# Patient Record
Sex: Female | Born: 1960 | Race: White | Hispanic: No | Marital: Married | Smoking: Never smoker
Health system: Southern US, Community
[De-identification: ages and names within clinical notes are randomized; demographics above are authoritative.]

## PROBLEM LIST (undated history)

## (undated) DIAGNOSIS — G43909 Migraine, unspecified, not intractable, without status migrainosus: Secondary | ICD-10-CM

## (undated) DIAGNOSIS — M25519 Pain in unspecified shoulder: Secondary | ICD-10-CM

## (undated) DIAGNOSIS — G44009 Cluster headache syndrome, unspecified, not intractable: Secondary | ICD-10-CM

## (undated) DIAGNOSIS — K219 Gastro-esophageal reflux disease without esophagitis: Secondary | ICD-10-CM

## (undated) DIAGNOSIS — I351 Nonrheumatic aortic (valve) insufficiency: Secondary | ICD-10-CM

## (undated) DIAGNOSIS — M8580 Other specified disorders of bone density and structure, unspecified site: Secondary | ICD-10-CM

## (undated) DIAGNOSIS — I4719 Other supraventricular tachycardia: Secondary | ICD-10-CM

## (undated) DIAGNOSIS — R55 Syncope and collapse: Secondary | ICD-10-CM

## (undated) DIAGNOSIS — I340 Nonrheumatic mitral (valve) insufficiency: Secondary | ICD-10-CM

## (undated) DIAGNOSIS — R42 Dizziness and giddiness: Secondary | ICD-10-CM

## (undated) DIAGNOSIS — M797 Fibromyalgia: Secondary | ICD-10-CM

## (undated) DIAGNOSIS — I10 Essential (primary) hypertension: Secondary | ICD-10-CM

## (undated) DIAGNOSIS — K8020 Calculus of gallbladder without cholecystitis without obstruction: Secondary | ICD-10-CM

## (undated) DIAGNOSIS — E042 Nontoxic multinodular goiter: Secondary | ICD-10-CM

## (undated) DIAGNOSIS — E663 Overweight: Secondary | ICD-10-CM

## (undated) HISTORY — DX: Pain in unspecified shoulder: M25.519

## (undated) HISTORY — DX: Other specified disorders of bone density and structure, unspecified site: M85.80

## (undated) HISTORY — DX: Migraine, unspecified, not intractable, without status migrainosus: G43.909

## (undated) HISTORY — DX: Dizziness and giddiness: R42

## (undated) HISTORY — DX: Syncope and collapse: R55

## (undated) HISTORY — DX: Calculus of gallbladder without cholecystitis without obstruction: K80.20

## (undated) HISTORY — DX: Nonrheumatic aortic (valve) insufficiency: I35.1

## (undated) HISTORY — DX: Nontoxic multinodular goiter: E04.2

## (undated) HISTORY — DX: Gastro-esophageal reflux disease without esophagitis: K21.9

## (undated) HISTORY — DX: Other supraventricular tachycardia: I47.19

## (undated) HISTORY — DX: Overweight: E66.3

## (undated) HISTORY — DX: Nonrheumatic mitral (valve) insufficiency: I34.0

## (undated) HISTORY — DX: Essential (primary) hypertension: I10

## (undated) HISTORY — DX: Fibromyalgia: M79.7

## (undated) HISTORY — DX: Cluster headache syndrome, unspecified, not intractable: G44.009

---

## 1998-11-09 ENCOUNTER — Other Ambulatory Visit: Admission: RE | Admit: 1998-11-09 | Discharge: 1998-11-09 | Payer: Self-pay | Admitting: Obstetrics and Gynecology

## 1999-11-09 ENCOUNTER — Other Ambulatory Visit: Admission: RE | Admit: 1999-11-09 | Discharge: 1999-11-09 | Payer: Self-pay | Admitting: Obstetrics and Gynecology

## 2000-11-27 ENCOUNTER — Other Ambulatory Visit: Admission: RE | Admit: 2000-11-27 | Discharge: 2000-11-27 | Payer: Self-pay | Admitting: Obstetrics and Gynecology

## 2001-11-28 ENCOUNTER — Other Ambulatory Visit: Admission: RE | Admit: 2001-11-28 | Discharge: 2001-11-28 | Payer: Self-pay | Admitting: Obstetrics and Gynecology

## 2002-12-04 ENCOUNTER — Other Ambulatory Visit: Admission: RE | Admit: 2002-12-04 | Discharge: 2002-12-04 | Payer: Self-pay | Admitting: Obstetrics and Gynecology

## 2003-11-01 HISTORY — PX: MYOMECTOMY: SHX85

## 2004-01-01 ENCOUNTER — Other Ambulatory Visit: Admission: RE | Admit: 2004-01-01 | Discharge: 2004-01-01 | Payer: Self-pay | Admitting: Obstetrics and Gynecology

## 2004-02-10 ENCOUNTER — Encounter (INDEPENDENT_AMBULATORY_CARE_PROVIDER_SITE_OTHER): Payer: Self-pay | Admitting: *Deleted

## 2004-02-10 ENCOUNTER — Inpatient Hospital Stay (HOSPITAL_COMMUNITY): Admission: RE | Admit: 2004-02-10 | Discharge: 2004-02-12 | Payer: Self-pay | Admitting: Obstetrics and Gynecology

## 2007-09-11 ENCOUNTER — Emergency Department (HOSPITAL_COMMUNITY): Admission: EM | Admit: 2007-09-11 | Discharge: 2007-09-12 | Payer: Self-pay | Admitting: Emergency Medicine

## 2007-09-12 IMAGING — CR DG CHEST 2V
2 series · 2 of 2 positions shown · non-contrast
Comparison: None.

CLINICAL DATA: Syncope..
 CHEST - 2 VIEW:

[w chest pa]
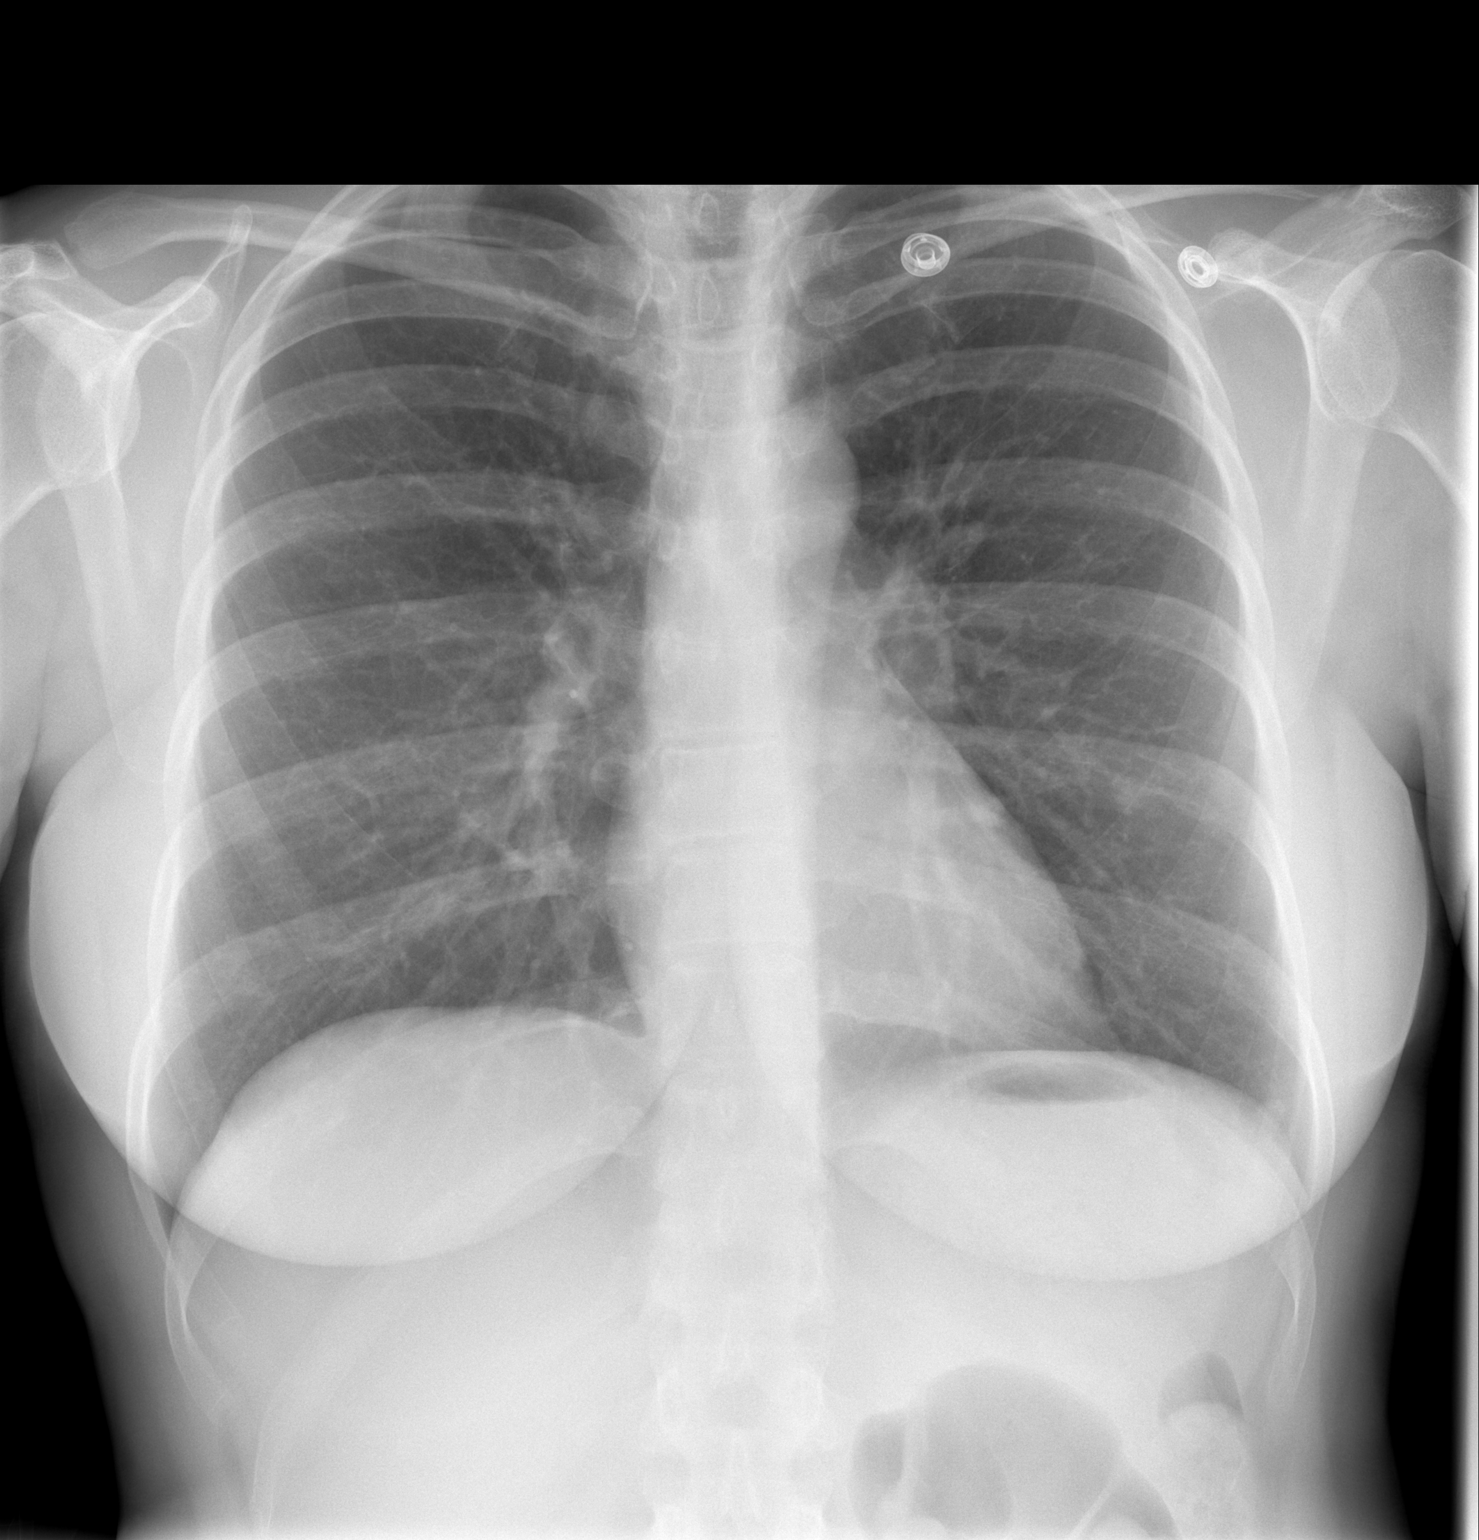

[w chest lat]
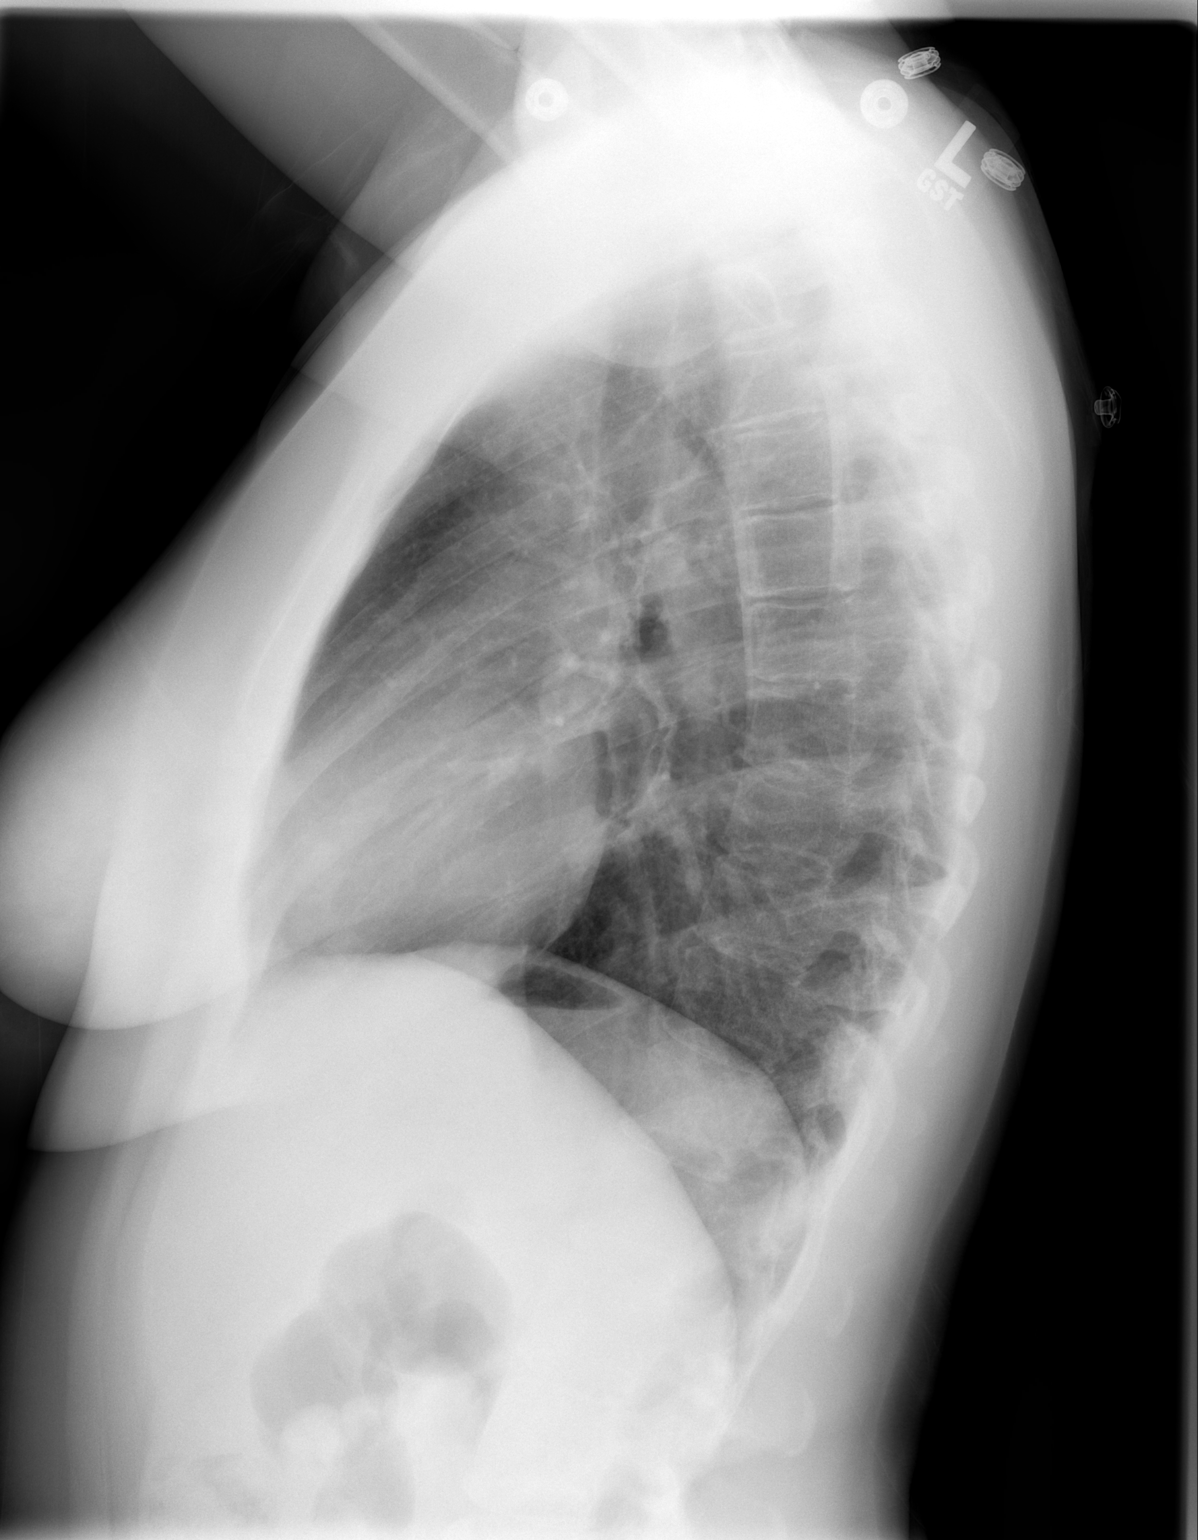

[2 of 2 positions shown; findings below may reference images not displayed]

FINDINGS: The lungs are clear. The costophrenic angles are sharp. Heart size normal. No bony abnormality.
IMPRESSION: Negative chest.

## 2010-10-31 HISTORY — PX: THYROID SURGERY: SHX805

## 2011-02-18 ENCOUNTER — Other Ambulatory Visit: Payer: Self-pay | Admitting: Family Medicine

## 2011-02-18 DIAGNOSIS — R221 Localized swelling, mass and lump, neck: Secondary | ICD-10-CM

## 2011-02-23 ENCOUNTER — Ambulatory Visit
Admission: RE | Admit: 2011-02-23 | Discharge: 2011-02-23 | Disposition: A | Discharge status: Home / Self Care | Payer: BC Managed Care – PPO | Source: Ambulatory Visit | Attending: Family Medicine | Admitting: Family Medicine

## 2011-02-23 DIAGNOSIS — R221 Localized swelling, mass and lump, neck: Secondary | ICD-10-CM

## 2011-02-23 IMAGING — US US SOFT TISSUE HEAD/NECK
1 series · 14 of 25 positions shown · non-contrast
Comparison: None.

CLINICAL DATA: Thyroid nodule on physical exam

THYROID ULTRASOUND
TECHNIQUE: Ultrasound examination of the thyroid gland and adjacent
soft tissues was performed.

[Series 1: us soft tissue head/neck · 0.10mm/px · 14 of 73 slices shown]
[im 1/73]
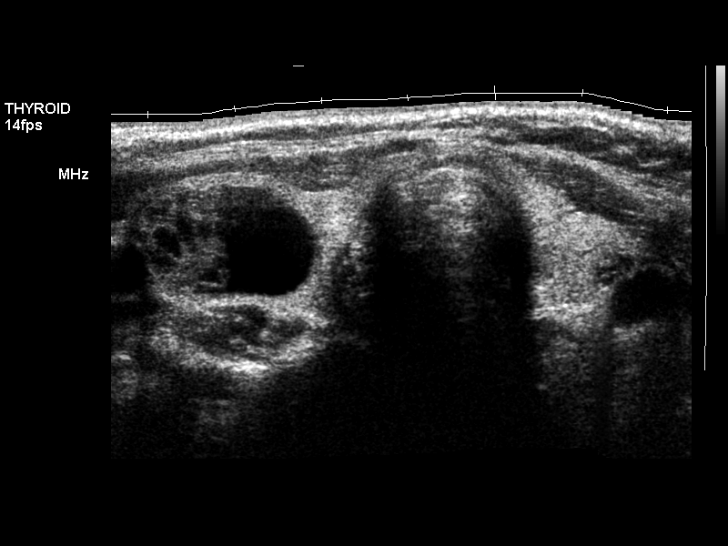
[im 7/73]
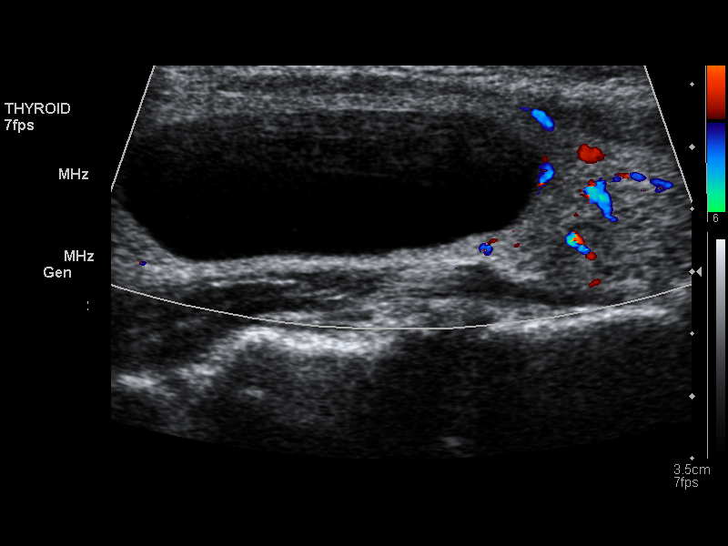
[im 13/73]
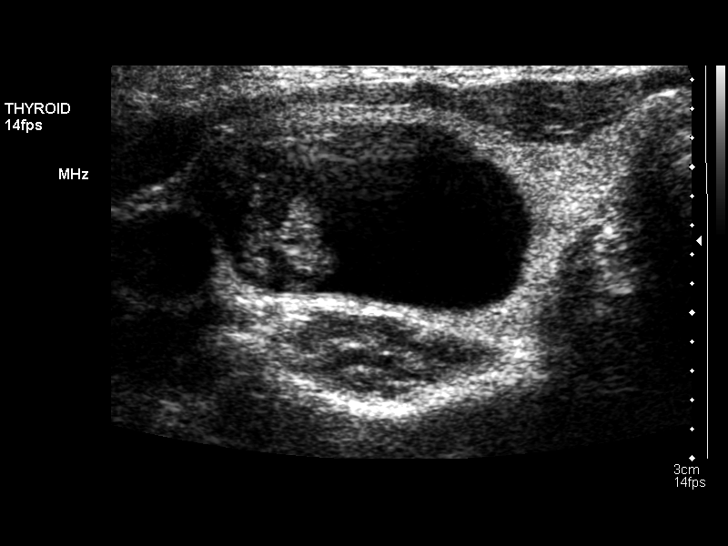
[im 19/73]
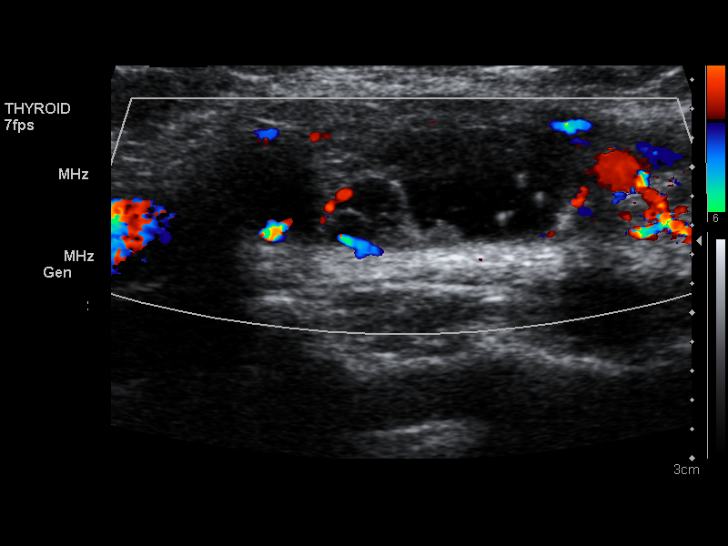
[im 25/73]
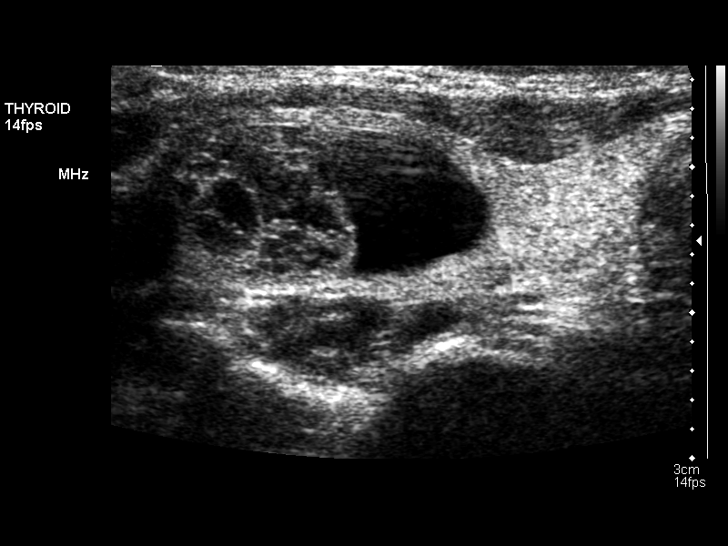
[im 28/73]
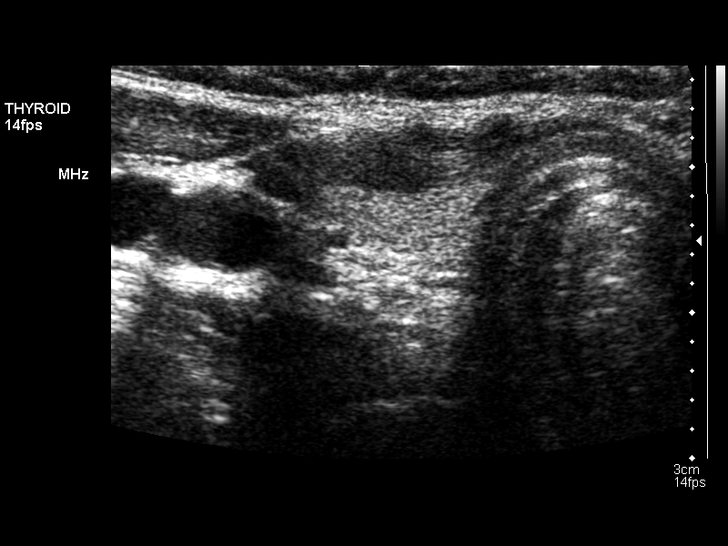
[im 34/73]
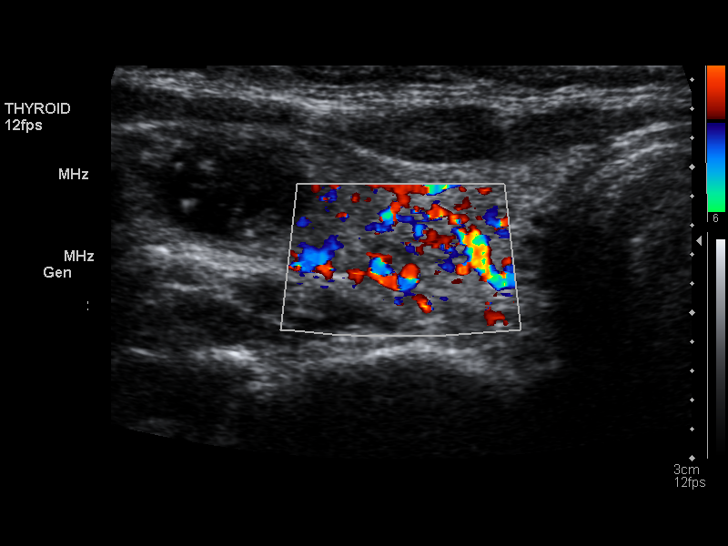
[im 40/73]
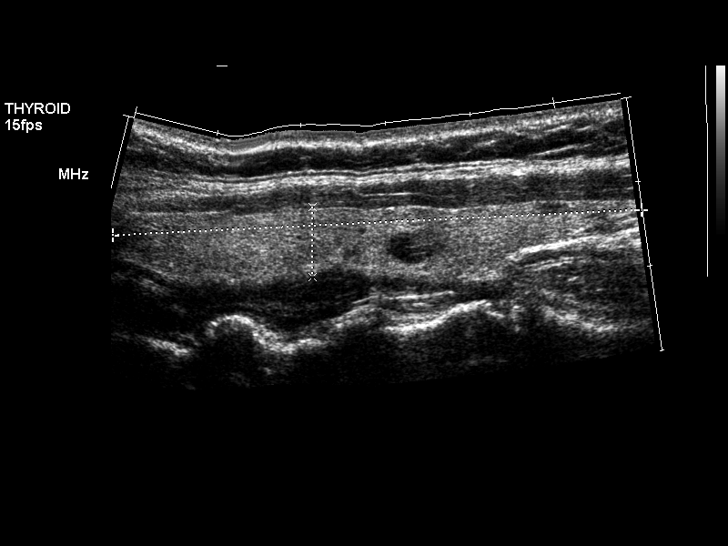
[im 46/73]
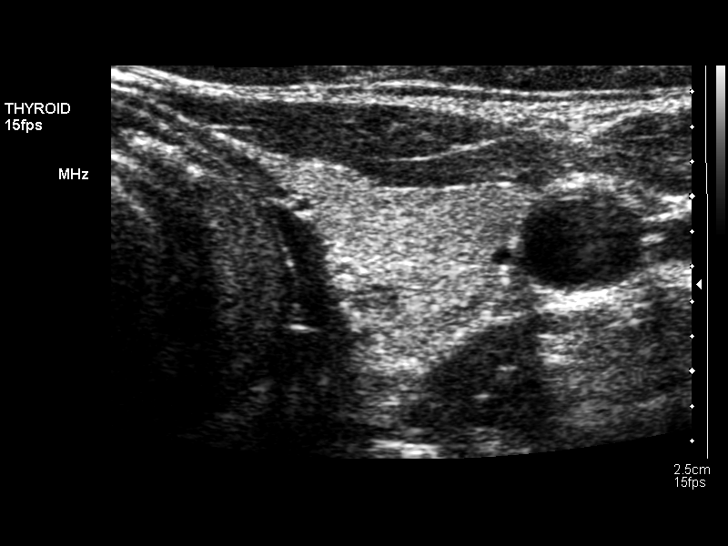
[im 49/73]
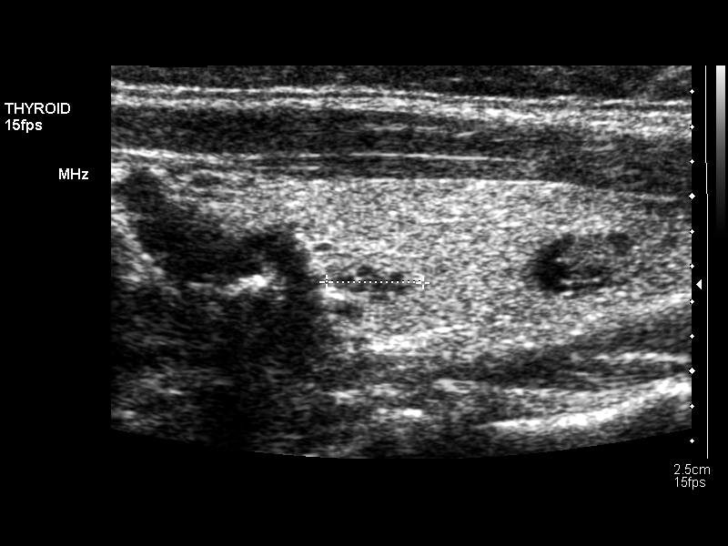
[im 55/73]
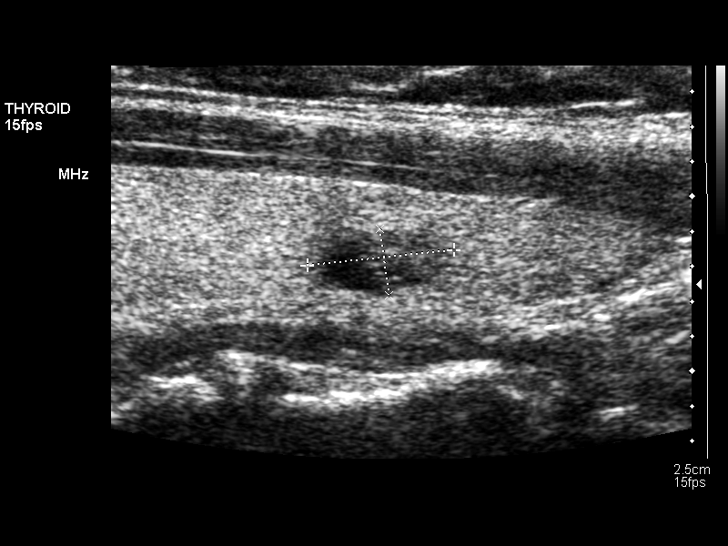
[im 61/73]
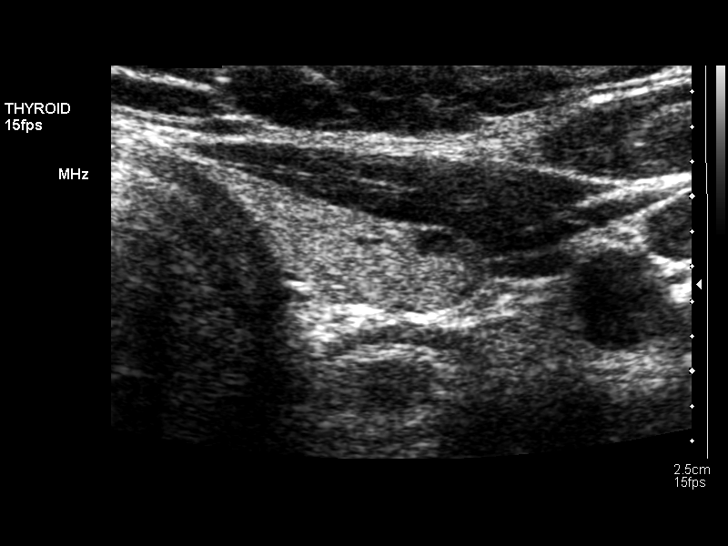
[im 67/73]
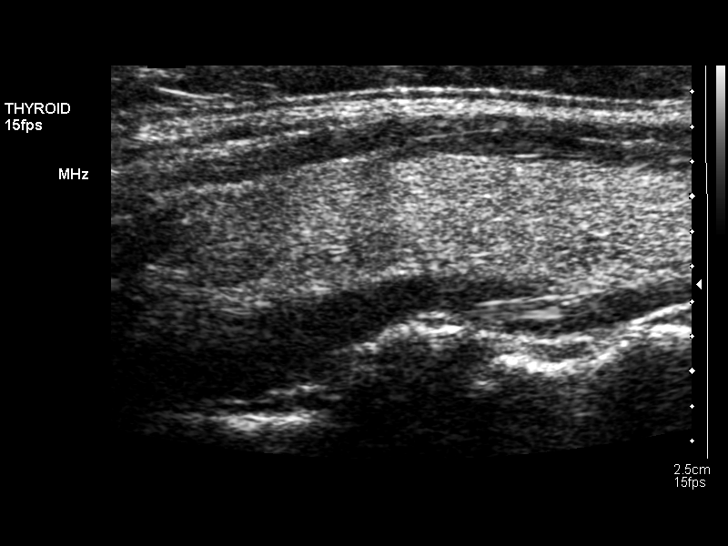
[im 73/73]
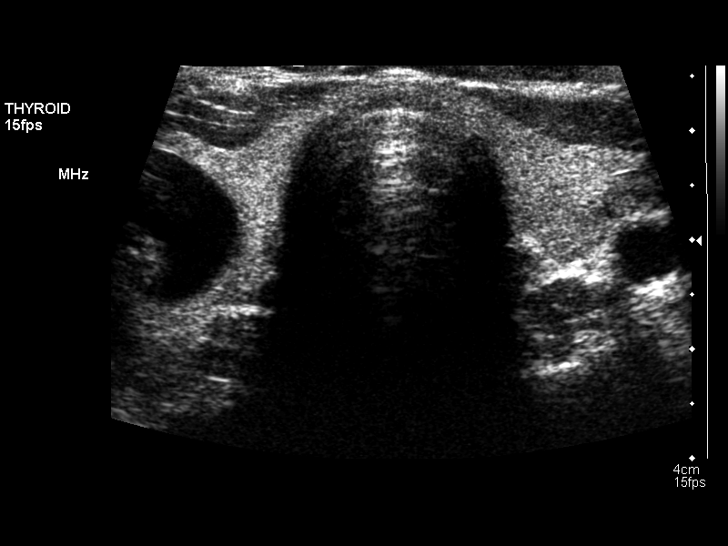

[14 of 25 positions shown; findings below may reference images not displayed]

FINDINGS: Right thyroid lobe:  6.2 x 1.6 x 2.7 cm.
Left thyroid lobe:  6.2 x 0.8 x 1.7 cm.
Isthmus:  3 mm.

Focal nodules:  A complex nodule is noted in the right lobe which
is primarily cystic with some internal soft tissue components
measuring 2.3 x 1.2 x 3.4 cm. Small nodules are noted bilaterally
of no more than 8 mm in diameter.

Lymphadenopathy:  None visualized.
IMPRESSION: The dominant nodule on the right is complex but largely cystic
measuring 2.3 x 1.2 x 3.4 cm.

## 2011-02-24 ENCOUNTER — Other Ambulatory Visit: Payer: Self-pay | Admitting: Family Medicine

## 2011-02-24 DIAGNOSIS — E041 Nontoxic single thyroid nodule: Secondary | ICD-10-CM

## 2011-03-01 ENCOUNTER — Other Ambulatory Visit (HOSPITAL_COMMUNITY)
Admission: RE | Admit: 2011-03-01 | Discharge: 2011-03-01 | Disposition: A | Discharge status: Home / Self Care | Payer: BC Managed Care – PPO | Source: Ambulatory Visit | Attending: Interventional Radiology | Admitting: Interventional Radiology

## 2011-03-01 ENCOUNTER — Ambulatory Visit
Admission: RE | Admit: 2011-03-01 | Discharge: 2011-03-01 | Disposition: A | Discharge status: Home / Self Care | Payer: BC Managed Care – PPO | Source: Ambulatory Visit | Attending: Family Medicine | Admitting: Family Medicine

## 2011-03-01 ENCOUNTER — Other Ambulatory Visit: Payer: Self-pay | Admitting: Interventional Radiology

## 2011-03-01 DIAGNOSIS — E049 Nontoxic goiter, unspecified: Secondary | ICD-10-CM | POA: Insufficient documentation

## 2011-03-01 DIAGNOSIS — E041 Nontoxic single thyroid nodule: Secondary | ICD-10-CM

## 2011-03-01 IMAGING — US US THYROID BIOPSY
1 series · 14 of 15 positions shown · non-contrast
Comparison: [DATE]

CLINICAL DATA: Dominant complex cystic right thyroid nodule

ULTRASOUND GUIDED NEEDLE ASPIRATE BIOPSY OF THE THYROID GLAND

[Series 1: us thyroid biopsy · 0.07mm/px · 15 acquisitions, 14 frames shown]
[im 1/15]
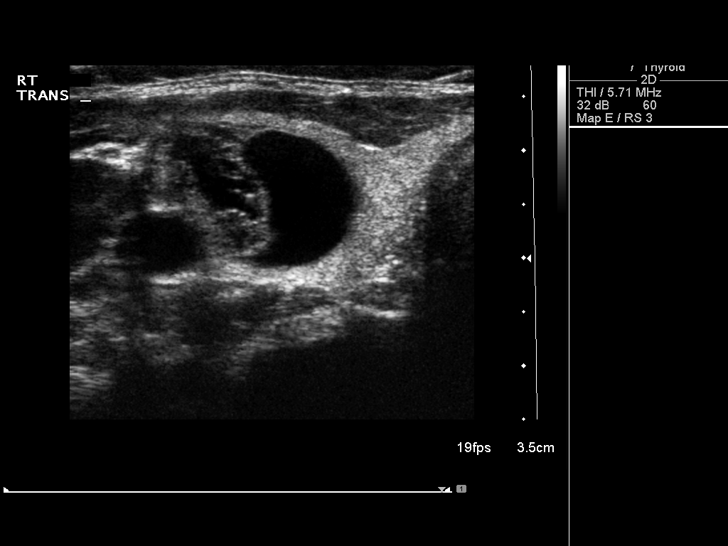
[im 2/15]
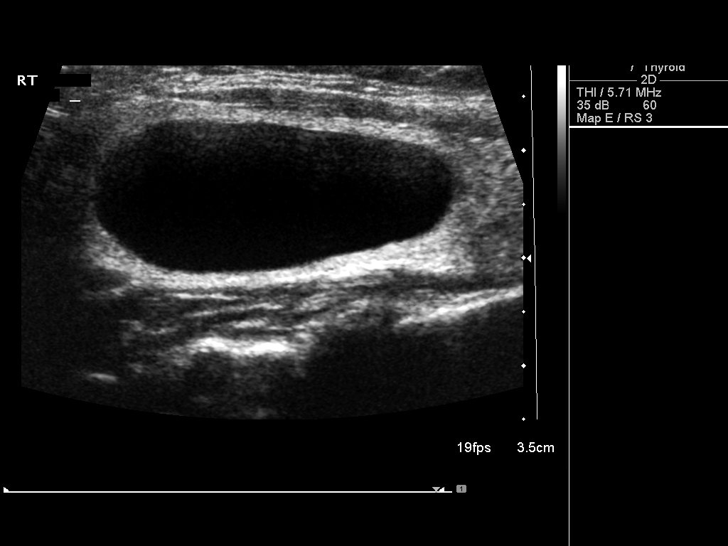
[im 3/15]
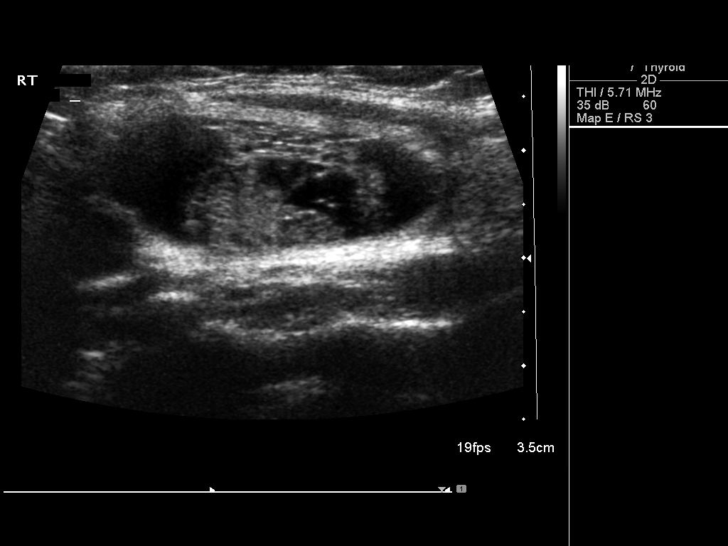
[im 4/15]
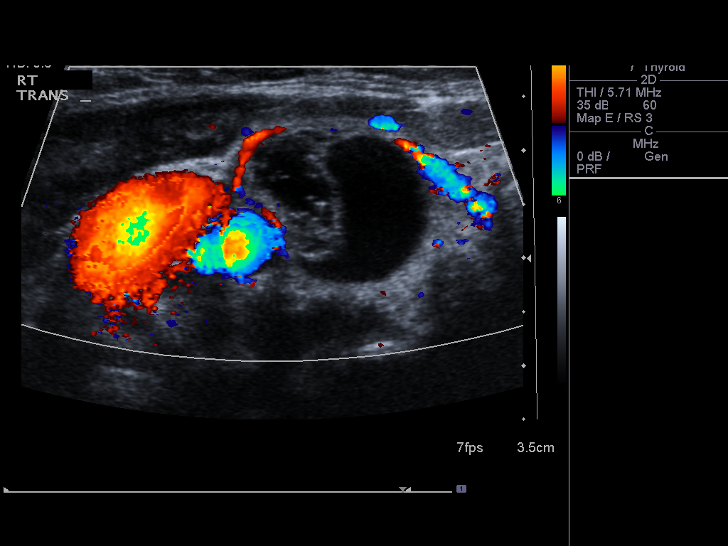
[im 5/15]
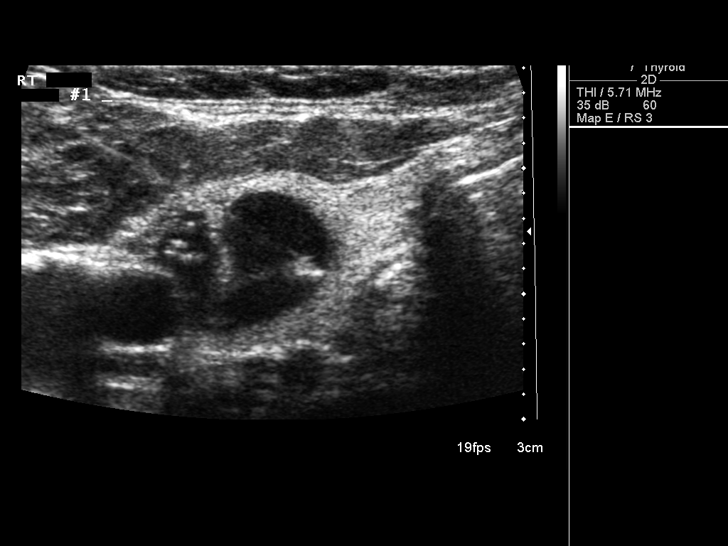
[im 6/15]
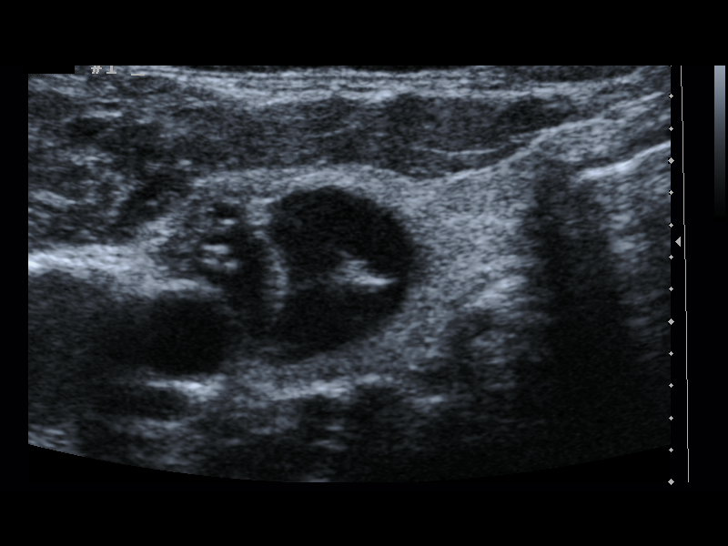
[im 7/15]
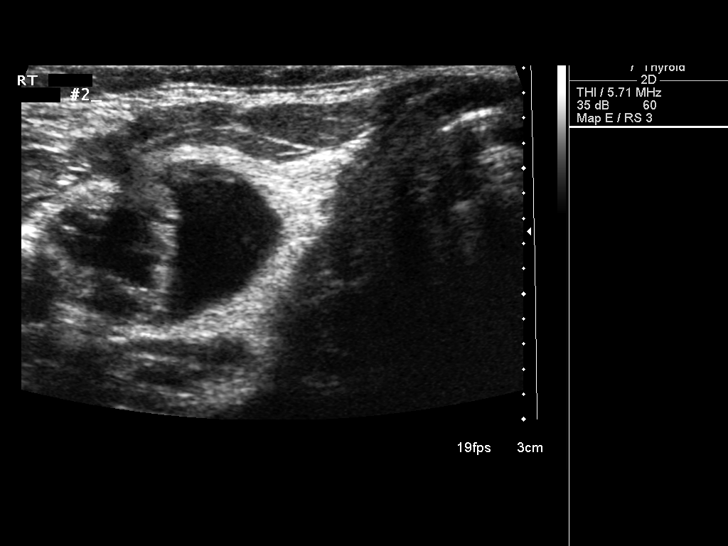
[im 9/15]
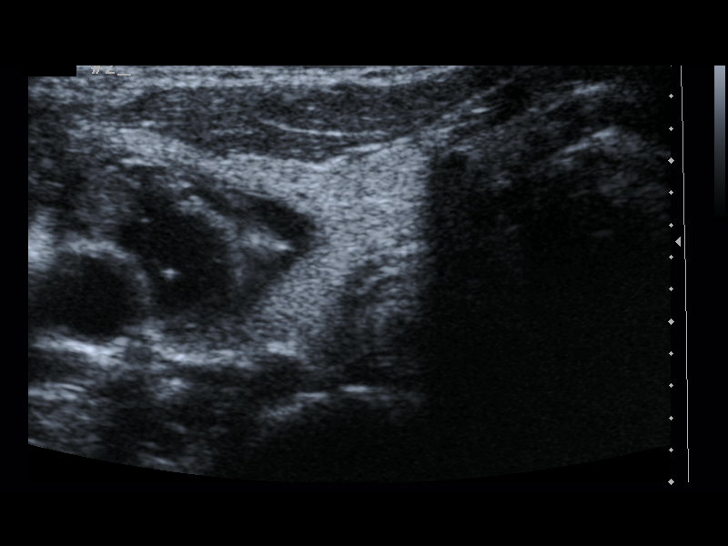
[im 10/15]
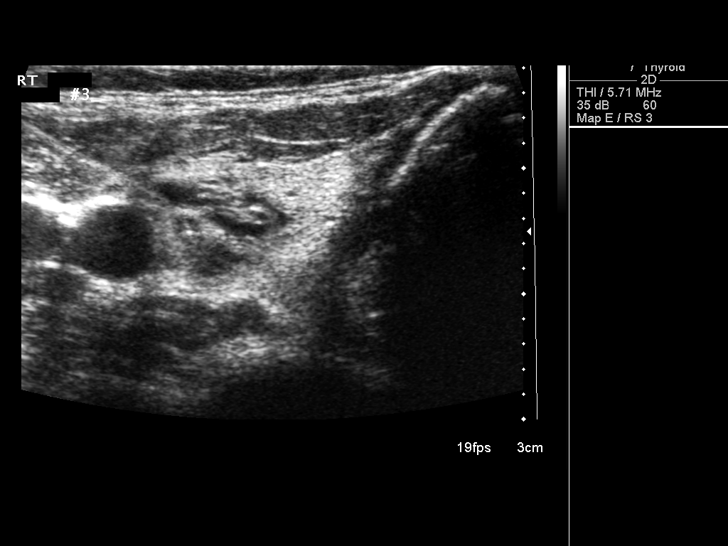
[im 11/15]
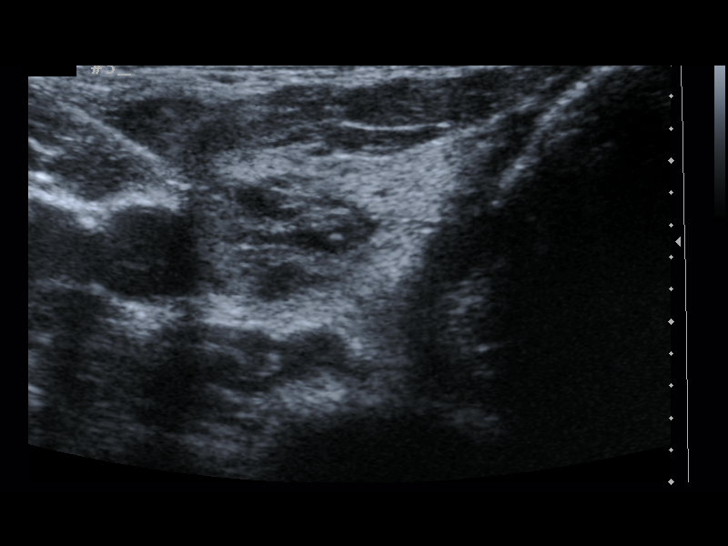
[im 12/15]
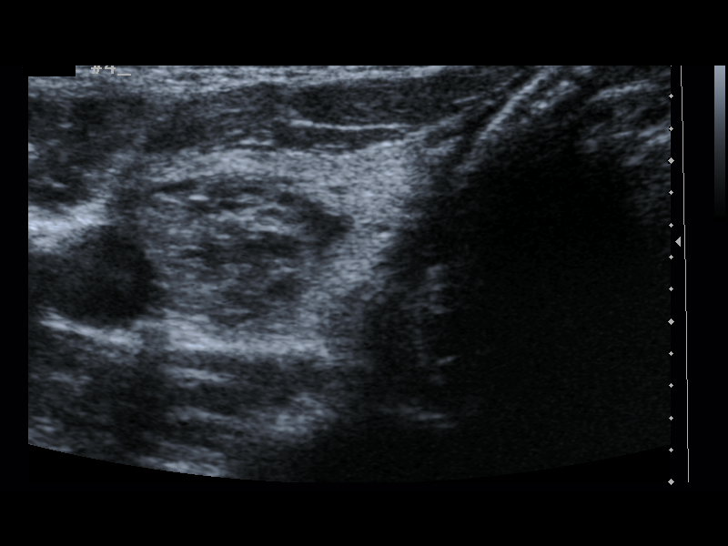
[im 13/15]
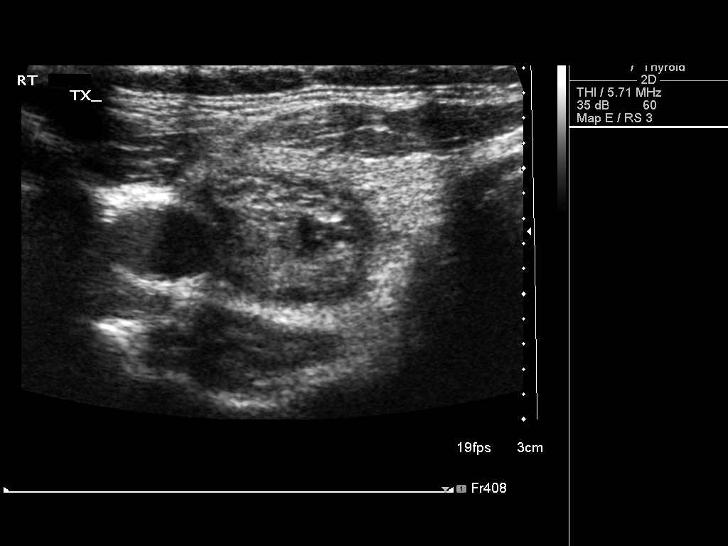
[im 14/15]
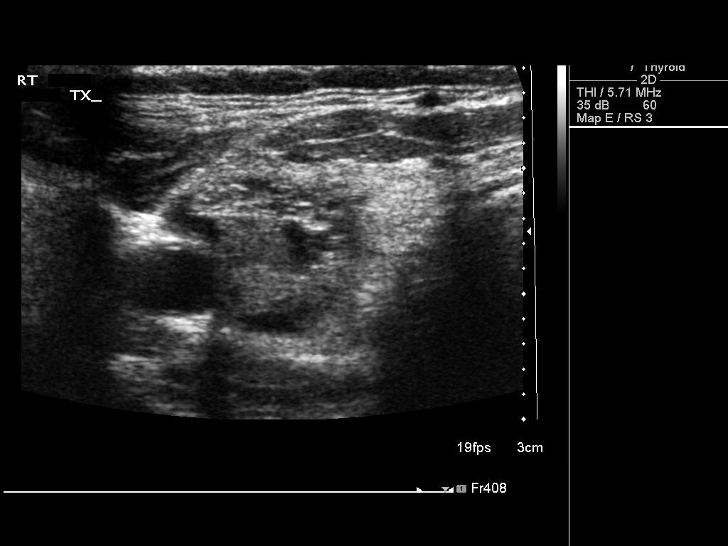
[im 15/15]
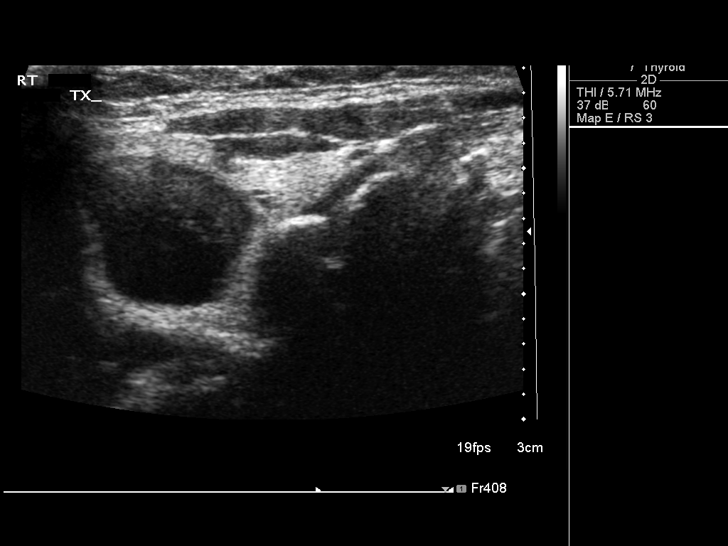

[14 of 15 positions shown; findings below may reference images not displayed]

Thyroid biopsy was thoroughly discussed with the patient and
questions were answered.  The benefits, risks, alternatives, and
complications were also discussed.  The patient understands and
wishes to proceed with the procedure.  Written consent was
obtained.

Ultrasound was performed to localize and mark an adequate site for
the biopsy.  The patient was then prepped and draped in a normal
sterile fashion.  Local anesthesia was provided with 1% lidocaine.
Using direct ultrasound guidance, 4 passes were made using 25 gauge
needles into the nodule within the right lobe of the thyroid.
Ultrasound was used to confirm needle placements on all occasions.
Specimens were sent to Pathology for analysis.

Complications:  No immediate
FINDINGS: Imaging confirms needle placement in the dominant right
complex cystic thyroid nodule
IMPRESSION: Ultrasound guided needle aspirate biopsy performed of the dominant
right thyroid nodule.

## 2011-03-18 NOTE — Op Note (Signed)
[name], [name]                            ACCOUNT NO.:  [id number]   MEDICAL RECORD NO.:  [id number]                   PATIENT TYPE:  INP   LOCATION:  9399                                 FACILITY:  [facility]   PHYSICIAN:  Maxie Better, M.D.            DATE OF BIRTH:  12-21-1960   DATE OF PROCEDURE:  02/10/2004  DATE OF DISCHARGE:                                 OPERATIVE REPORT   PREOPERATIVE DIAGNOSES:  Menorrhagia, uterine fibroids.   POSTOPERATIVE DIAGNOSES:  Submucosal fibroids, subserosal intramural and  pedunculated fibroids, menorrhagia.   PROCEDURE:  Exploratory laparotomy, multiple myomectomy.   ANESTHESIA:  General.   SURGEON:  Maxie Better, M.D.   ASSISTANT:  Cordelia Pen A. Rosalio Macadamia, M.D.   INDICATIONS FOR PROCEDURE:  This is a 50 year old, gravida 0, female on  birth control pills with symptomatic uterine fibroids who now presents for  surgical management.  Please see the dictated H&P for the specific details.  The risks and benefits of the procedure have been explained to the patient,  consent was signed, the patient was transferred to the operating room.   DESCRIPTION OF PROCEDURE:  Under adequate general anesthesia, the patient  was placed in the supine position.  She was sterilely prepped and draped in  the usual fashion, indwelling Foley catheter was sterilely placed.  A 0.25%  Marcaine was injected along the planned Pfannenstiel skin incision. A small  Pfannenstiel skin incision was then made, carried down to the rectus fascia,  the rectus fascia was incised in the midline, extended bilaterally. The  rectus fascia was bluntly and sharply dissected off the rectus muscle and  superior and inferior fascia in the rectus muscle was split in the midline.  The paritoperitoneum was entered bluntly. The uterus was then exteriorized  and was noted to have multiple fibroids. Both tubes and ovaries were normal.  The fibroids were located right posterior cornual  area about 2 1/2 cm,  several other smaller 1 cm less than a pea size and a large posterior  fibroid that was felt to be practically intracavitary.  A dilute solution of  Pitressin was injected along the planned posterior incision. A posterior  incision was then made vertically and incorporated the right posterior  fundal/cornual fibroids. The intracavitary fibroid was then removed at which  time the cavity had been clearly entered, palpation of the cavity was  notable for multiple smaller submucosal fibroids on the anterior aspect of  the uterus. The additional smaller fibroids were then removed in a similar  fashion including the submucosal fibroids. There was no endometrial polyp  noted. After all fibroids had been removed and the uterus palpated and no  other palpable fibroids noted, the incisions were then closed taking care  not to incorporate the endometrial stripe. The defect in the uterus was  closed with #0 Vicryl sutures to the subserosal surface at which time a 3-0  Vicryl baseball stitch was  then utilized to close the large posterior  incision and the smaller incisions on the uterus for the subserosal  fibroids.  Additional suturing was then performed to accomplish hemostasis.  Good hemostasis was then noted, the abdomen was then irrigated, suctioned of  debris and Intercede was placed over the overlying incision. The uterus was  then placed back in the abdomen, the upper abdomen and the appendix were not  explored.  The parietoperitoneum was now closed. The under surface of the  rectus fascia was inspected, small bleeders cauterized. The fascia was  closed with #0 Vicryl x2, the subcutaneous area was irrigated, small  bleeders cauterized and a subcuticular stitch was then performed using 4-0  Vicryl suture.  The specimen was myoma x19, estimated blood loss was 50 mL,  intraoperative fluid was 2 liters, urine output was 425 mL clear yellow  urine. Sponge and instrument counts  x2 was correct. Complications none.  The  patient tolerated the procedure well and was transferred to recovery in  stable condition.                                               Maxie Better, M.D.    [place]/MEDQ  D:  02/10/2004  T:  02/10/2004  Job:  [id number]

## 2011-03-18 NOTE — Discharge Summary (Signed)
[name], [name]                            ACCOUNT NO.:  [id number]   MEDICAL RECORD NO.:  [id number]                   PATIENT TYPE:  INP   LOCATION:  9311                                 FACILITY:  [facility]   PHYSICIAN:  Maxie Better, M.D.            DATE OF BIRTH:  [birth year]   DATE OF ADMISSION:  02/10/2004  DATE OF DISCHARGE:                                 DISCHARGE SUMMARY   ADMISSION DIAGNOSIS:  Menorrhagia, uterine fibroid.   DISCHARGE DIAGNOSES:  1. Submucosal fibroid.  2. Subserosal, intramural, and pedunculated fibroids.  3. Menorrhagia.  4. Postoperative anemia.   PROCEDURE:  Exploratory laparotomy with multiple myomectomy.   HISTORY OF PRESENT ILLNESS:  A 50 year old gravida 0 female on birth control  pills with symptomatic uterine fibroids admitted for surgical management.   HOSPITAL COURSE:  The patient was admitted to [facility].  She was  taken to the operating room where she underwent exploratory laparotomy and  multiple myomectomy.  Please see the dictated operative note for the  specific details.  The patient's postoperative course was complicated by  temperature maximum of 100.3 which subsequently spontaneously defervesced.  The patient was passing flatus by postoperative day #1, tolerating a regular  diet, and by postoperative day #2 was desirous of going home.  Her CBC on  postoperative day #1 showed a hemoglobin of 10.7, hematocrit 32.4, white  count of 7.3  Her incision showed no erythema, induration, or exudate.  Her  pathology was consistent with myomas.   DISPOSITION:  Home.   CONDITION:  Stable.   DISCHARGE MEDICATIONS:  1. Tylox #20 one to two tablets q.3-4h. p.r.n. pain.  2. Motrin 800 mg one p.o. q.6-8h. p.r.n. pain.   DISCHARGE INSTRUCTIONS:  As per the GYN sheet.   FOLLOW-UP APPOINTMENT:  In 4-6 weeks.                                               Maxie Better, M.D.    [place]/MEDQ  D:  02/12/2004  T:  02/12/2004  Job:   [id number]

## 2011-03-18 NOTE — H&P (Signed)
[name], [name]                            ACCOUNT NO.:  [id number]   MEDICAL RECORD NO.:  [id number]                   PATIENT TYPE:  INP   LOCATION:  NA                                   FACILITY[facility]  [facility]   PHYSICIAN:  Maxie Better, M.D.            DATE OF BIRTH:  1961-07-05   DATE OF ADMISSION:  02/10/2004  DATE OF DISCHARGE:                                HISTORY & PHYSICAL   CHIEF COMPLAINT:  Uterine fibroids, menorrhagia.   HISTORY OF PRESENT ILLNESS:  A 50 year old G0, single white female with  symptomatic uterine fibroids, now being admitted for a myomectomy.  Ultrasound on November 17, 2003, revealed a 3.5 x 3.2 x 4 cm anterior fibroid  pushing into the endometrial cavity.  The patient had a sonohysterogram on  December 15, 2003, that confirmed submucosal fibroid component.  Endometrial  biopsy on November 03, 2003, showed presence of a benign endometrial polyp,  proliferative endometrium.  The patient has had heavy bleeding despite the  use of oral contraceptive therapy.  The patient also describes very painful  cycles despite pills.  The patient does not desire any childbearing, however  wants conservative management at this time.   PAST MEDICAL HISTORY:  No known drug allergies.   Medicines are Maxalt p.r.n., Alesse.   Medical history is migraines.   Surgical history is negative.   FAMILY HISTORY:  Positive for hypertension in mother and father.  No  ovarian, colon, or breast cancer.   SOCIAL HISTORY:  Same partner for 20 years.  Works at [employer], [employer].   REVIEW OF SYSTEMS:  As per HPI with respect to GU system.  The patient also  complains of decreased libido.  She has had blood in the stool in the past,  for which she underwent a GI evaluation in 2001.   PHYSICAL EXAMINATION:  GENERAL:  A well-developed, well-nourished white  female in no acute distress.  VITAL SIGNS:  Blood pressure 122/80, temperature of 98.4, weight is 164  pounds.  SKIN:  No  lesions.  HEENT:  Anicteric sclerae, pink conjunctivae, oropharynx negative.  CARDIAC:  Regular rate and rhythm without murmur.  CHEST:  Lungs are clear to auscultation.  BREASTS:  Soft, nontender, no palpable mass.  NODES:  No palpable supraclavicular, axillary, or inguinal nodes.  NECK:  Supple, thyroid not palpable.  BACK:  No CVA tenderness.  ABDOMEN:  Soft, nontender.  PELVIC:  Vulva shows no lesions.  Vagina had no discharge.  Cervix was round  os.  Uterus was retroverted, bulky.  Adnexa:  No palpable mass.  RECTAL:  Deferred.   IMPRESSION:  Symptomatic uterine fibroids.   PLAN:  Admission, myomectomy.  Routine admission labs.  Risk of procedure  was reviewed, including but not limited to infection, bleeding, possible  need for blood transfusion, up to 30% chance of needing myomectomy or  hysterectomy in the future, injury to surrounding organ  structure, internal  scar tissue.  Discussed with the patient the possibility of adenomyoma  mimicking fibroids, about less than 2% chance of need for hysterectomy for  life-threatening bleeding.  Postop care and criteria for discharge were  reviewed.  All questions answered.                                               Maxie Better, M.D.    [place]/MEDQ  D:  02/09/2004  T:  02/09/2004  Job:  [id number]

## 2011-03-21 ENCOUNTER — Other Ambulatory Visit: Payer: Self-pay | Admitting: Obstetrics and Gynecology

## 2011-08-09 LAB — BASIC METABOLIC PANEL
BUN: 16
Calcium: 9.5
GFR calc non Af Amer: >60
Glucose, Bld: 109 — ABNORMAL HIGH
Potassium: 4.3

## 2011-08-09 LAB — CBC
HCT: 42
Hemoglobin: 14.1
MCHC: 33.5
MCV: 93
Platelets: 305
RBC: 4.52
RDW: 14
WBC: 7.4

## 2011-08-09 LAB — DIFFERENTIAL
Basophils Absolute: 0
Basophils Relative: 1
Eosinophils Absolute: 0.1 — ABNORMAL LOW
Eosinophils Relative: 1
Lymphocytes Relative: 22
Lymphs Abs: 1.6
Monocytes Absolute: 0.5
Monocytes Relative: 6
Neutro Abs: 5.2
Neutrophils Relative %: 71

## 2011-08-09 LAB — BASIC METABOLIC PANEL WITH GFR
CO2: 32
Chloride: 98
Creatinine, Ser: 0.97
GFR calc Af Amer: >60
Sodium: 139

## 2011-08-09 LAB — D-DIMER, QUANTITATIVE: D-Dimer, Quant: <0.22

## 2011-08-09 LAB — POCT PREGNANCY, URINE
Operator id: 272551
Preg Test, Ur: NEGATIVE

## 2011-11-01 HISTORY — PX: OTHER SURGICAL HISTORY: SHX169

## 2012-01-16 ENCOUNTER — Encounter (INDEPENDENT_AMBULATORY_CARE_PROVIDER_SITE_OTHER): Payer: BC Managed Care – PPO | Admitting: Ophthalmology

## 2012-01-16 DIAGNOSIS — H3530 Unspecified macular degeneration: Secondary | ICD-10-CM

## 2012-01-16 DIAGNOSIS — H35379 Puckering of macula, unspecified eye: Secondary | ICD-10-CM

## 2012-01-16 DIAGNOSIS — H43819 Vitreous degeneration, unspecified eye: Secondary | ICD-10-CM

## 2012-01-16 DIAGNOSIS — H2510 Age-related nuclear cataract, unspecified eye: Secondary | ICD-10-CM

## 2012-02-23 ENCOUNTER — Other Ambulatory Visit: Payer: Self-pay | Admitting: Family Medicine

## 2012-02-23 DIAGNOSIS — E049 Nontoxic goiter, unspecified: Secondary | ICD-10-CM

## 2012-04-11 ENCOUNTER — Ambulatory Visit
Admission: RE | Admit: 2012-04-11 | Discharge: 2012-04-11 | Disposition: A | Discharge status: Home / Self Care | Payer: BC Managed Care – PPO | Source: Ambulatory Visit | Attending: Family Medicine | Admitting: Family Medicine

## 2012-04-11 DIAGNOSIS — E049 Nontoxic goiter, unspecified: Secondary | ICD-10-CM

## 2012-04-11 IMAGING — US US SOFT TISSUE HEAD/NECK
1 series · 14 of 25 positions shown · non-contrast
Comparison: Ultrasound of the thyroid of [DATE]

CLINICAL DATA: History of thyroid goiter, follow-up

THYROID ULTRASOUND
TECHNIQUE: Ultrasound examination of the thyroid gland and adjacent
soft tissues was performed.

[Series 1: us soft tissue head/neck · 0.05mm/px · 14 of 50 slices shown]
[im 1/50]
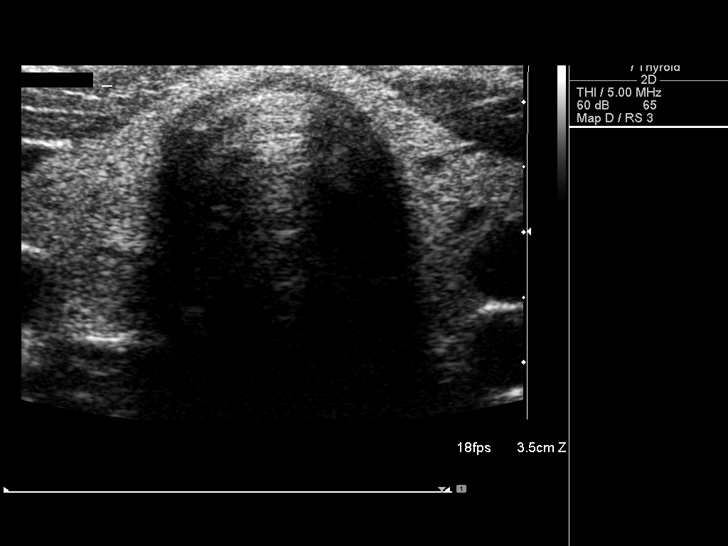
[im 5/50]
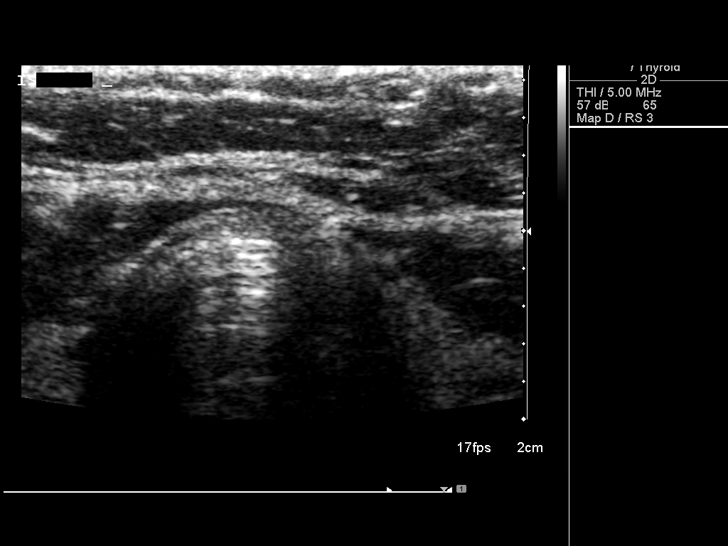
[im 9/50]
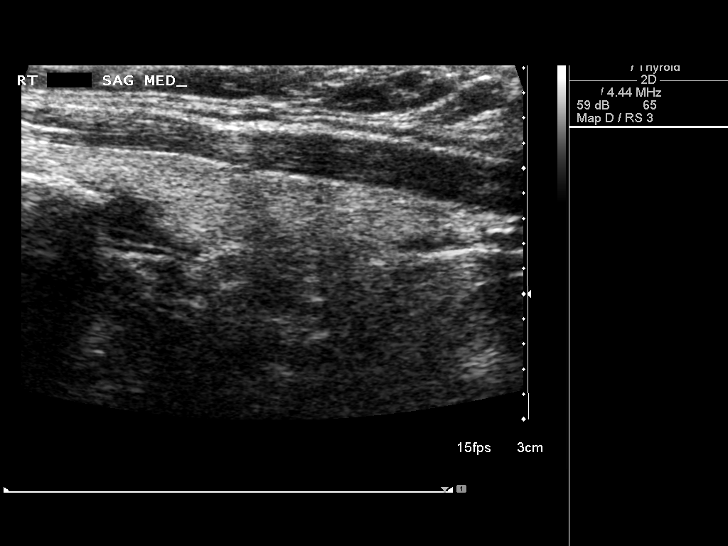
[im 13/50]
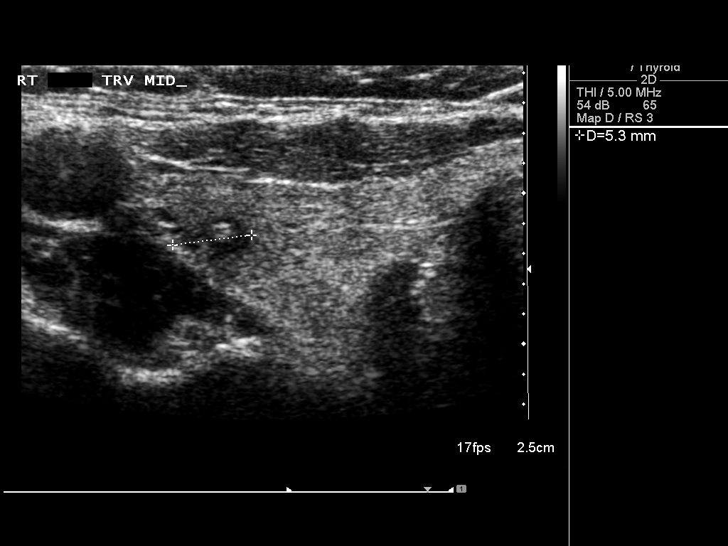
[im 17/50]
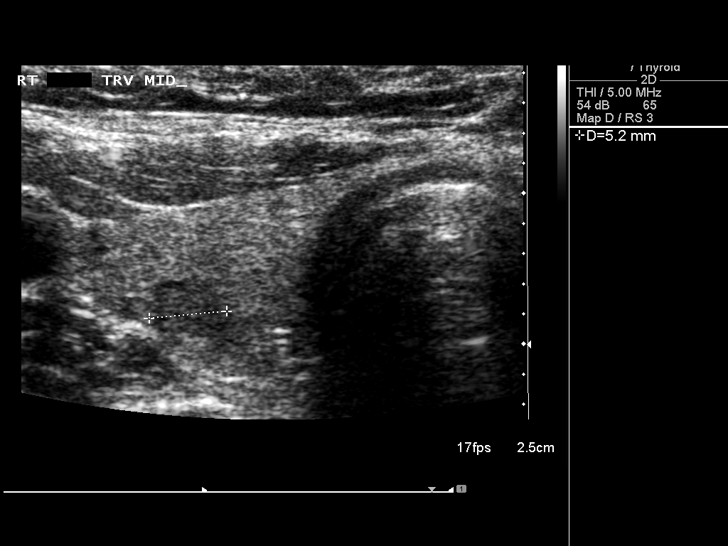
[im 19/50]
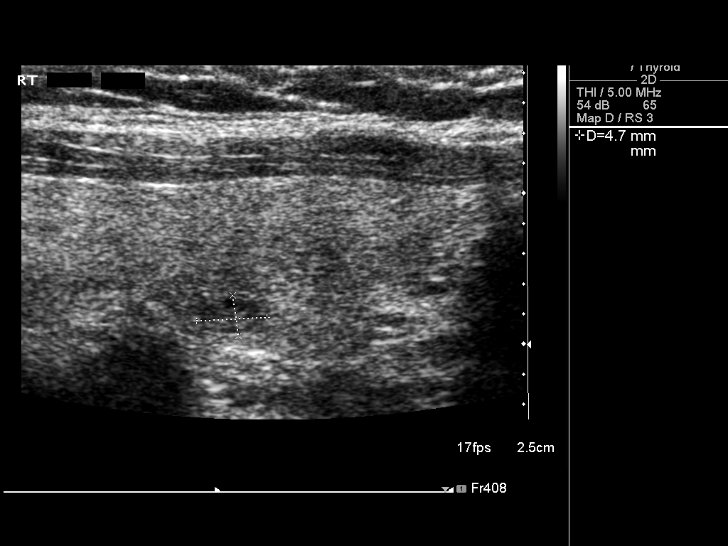
[im 23/50]
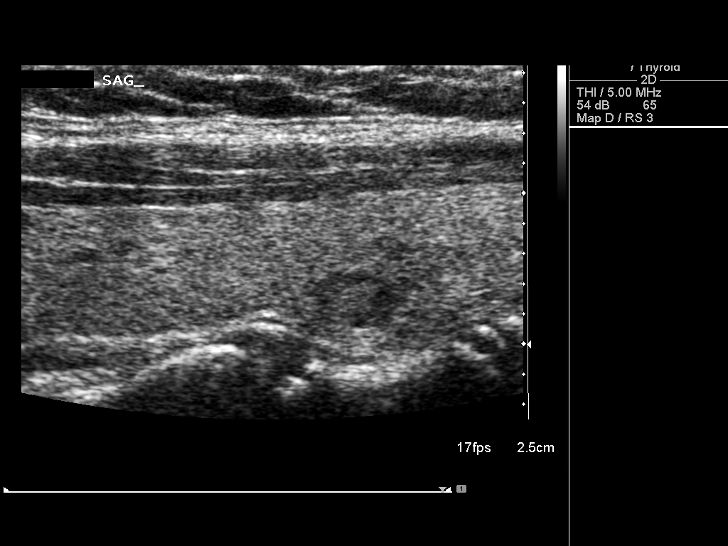
[im 27/50]
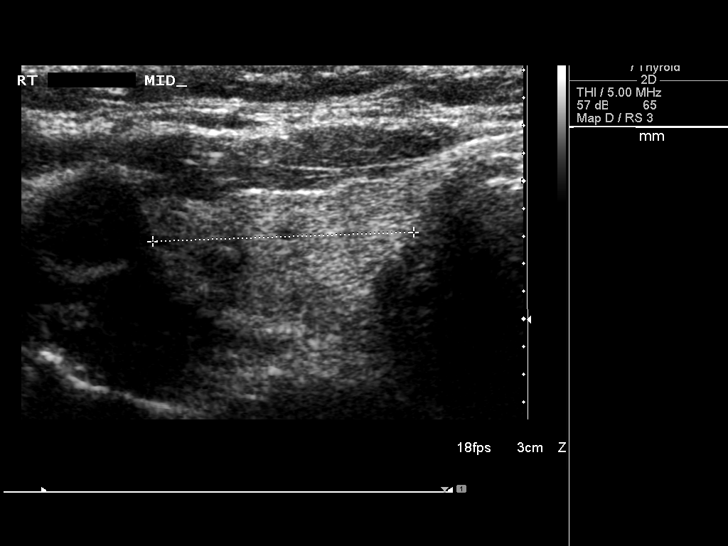
[im 31/50]
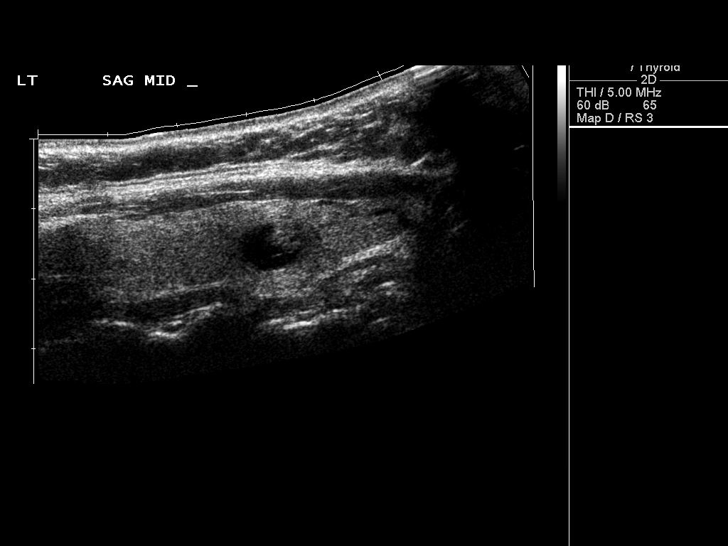
[im 33/50]
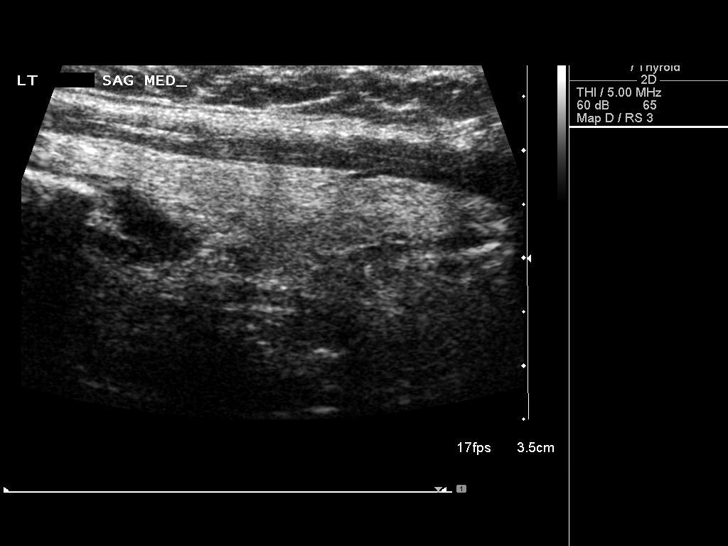
[im 37/50]
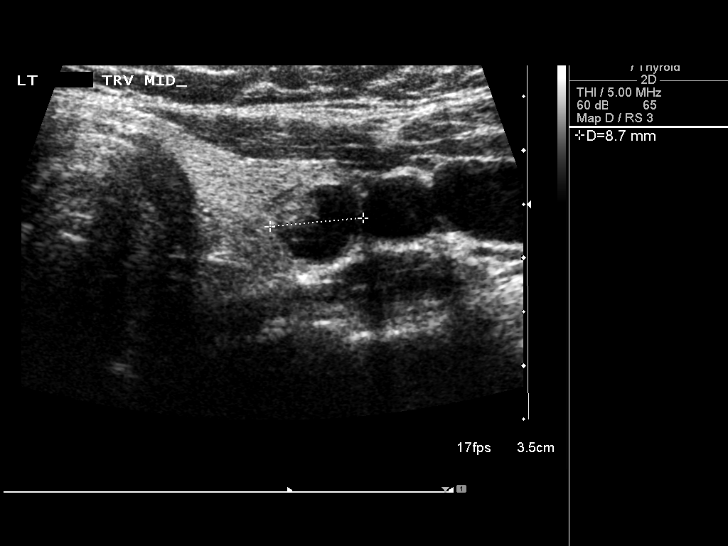
[im 41/50]
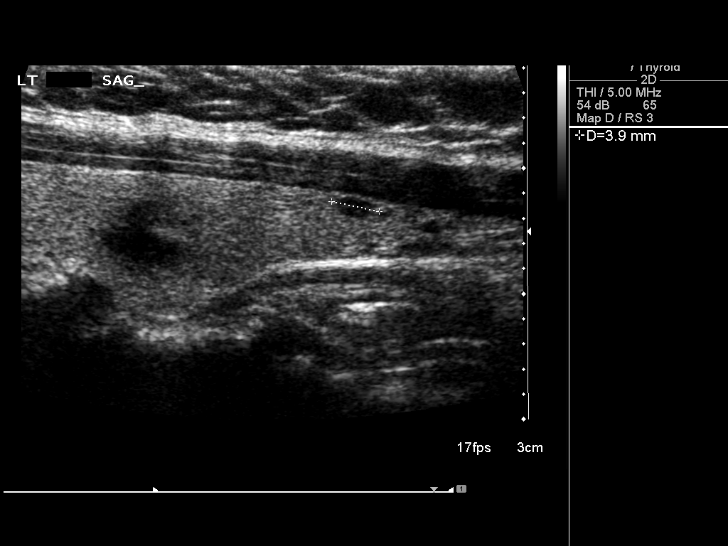
[im 45/50]
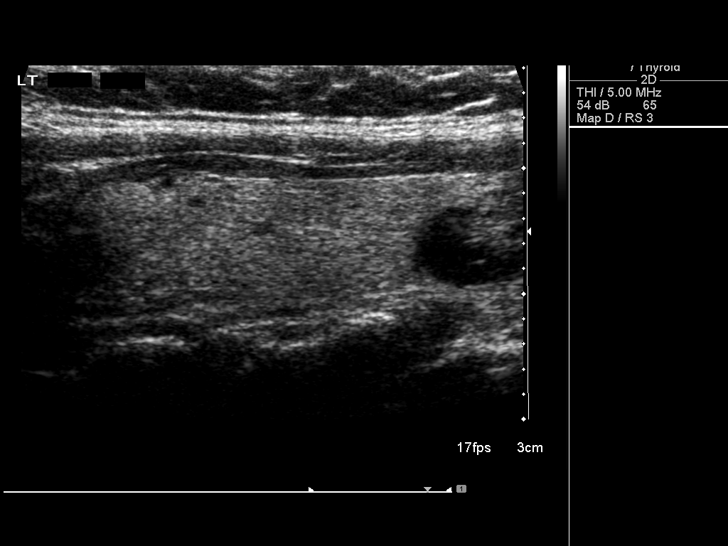
[im 50/50]
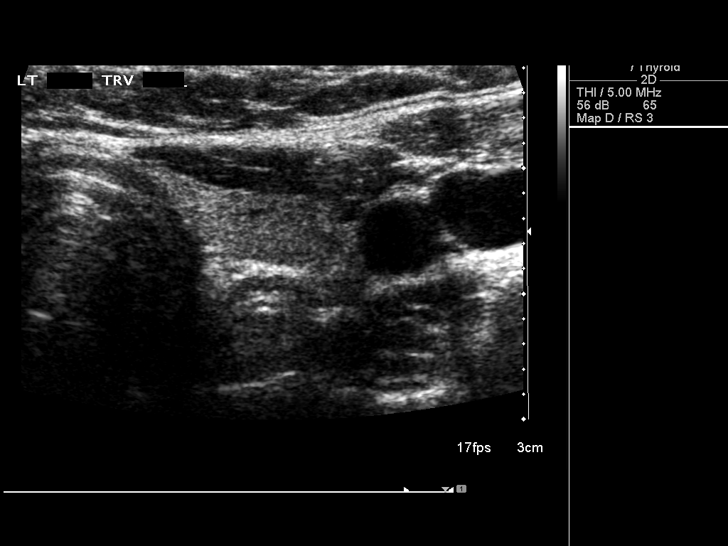

[14 of 25 positions shown; findings below may reference images not displayed]

FINDINGS: Right thyroid lobe:  6.1 x 1.6 x 1.9 cm.  (Previously 6.2 x 1.6 x
2.7 cm).
Left thyroid lobe:  5.8 x 1.5 x 1.6 cm.  (Previously 6.2 x 0.8 x
1.7 cm).
Isthmus:  3 mm in thickness.

Focal nodules:  The echogenicity of the thyroid gland is slightly
inhomogeneous.  There are nodules bilaterally.  The largest solid
nodule is in the right mid lobe measuring 1.0 x 0.5 x 0.4 cm, and
is somewhatindistinct.  Previously a complex lesion was present at
this site measuring 2.3 x 1.2 x 3.4 cm.  A complex nodule the left
mid lobe measures 1.3 x 0.6 x 0.9 cm, previously measuring 0.8 x
0.4 x 0.6 cm..  The remainder of nodules measure no more than 6 mm
in diameter.

Lymphadenopathy:  None visualized.
IMPRESSION: Decrease in size of a complex nodule in the right mid lobe which
now appears more solid measuring 1.0 x 0.4 x 0.5 cm.  The remainder
of thyroid nodules appear relatively stable.

## 2013-03-28 ENCOUNTER — Other Ambulatory Visit: Payer: Self-pay | Admitting: Family Medicine

## 2013-03-28 DIAGNOSIS — E042 Nontoxic multinodular goiter: Secondary | ICD-10-CM

## 2013-04-08 ENCOUNTER — Ambulatory Visit
Admission: RE | Admit: 2013-04-08 | Discharge: 2013-04-08 | Disposition: A | Discharge status: Home / Self Care | Payer: BC Managed Care – PPO | Source: Ambulatory Visit | Attending: Family Medicine | Admitting: Family Medicine

## 2013-04-08 DIAGNOSIS — E042 Nontoxic multinodular goiter: Secondary | ICD-10-CM

## 2013-04-08 IMAGING — US US SOFT TISSUE HEAD/NECK
1 series · 13 of 25 positions shown · non-contrast
Comparison: Ultrasound of the thyroid of [DATE]

CLINICAL DATA: Follow up of thyroid nodules

THYROID ULTRASOUND
TECHNIQUE: Ultrasound examination of the thyroid gland and adjacent
soft tissues was performed.

[Series 1: us soft tissue head/neck · 0.08mm/px · 13 of 61 slices shown]
[im 1/61]
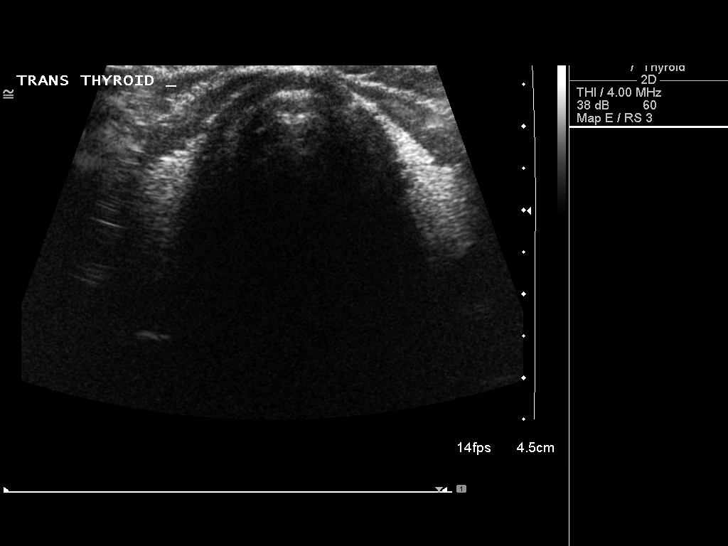
[im 6/61]
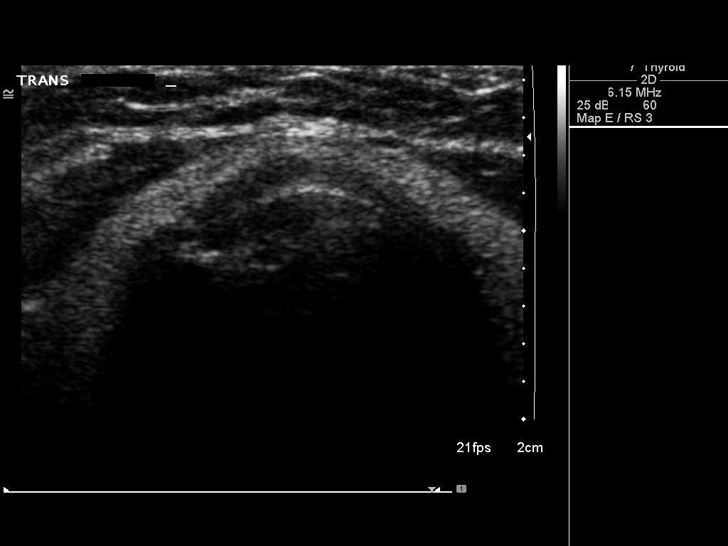
[im 11/61]
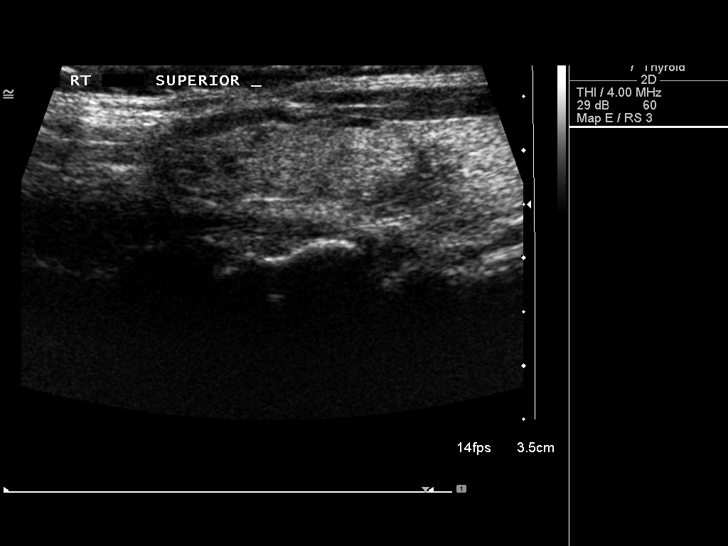
[im 16/61]
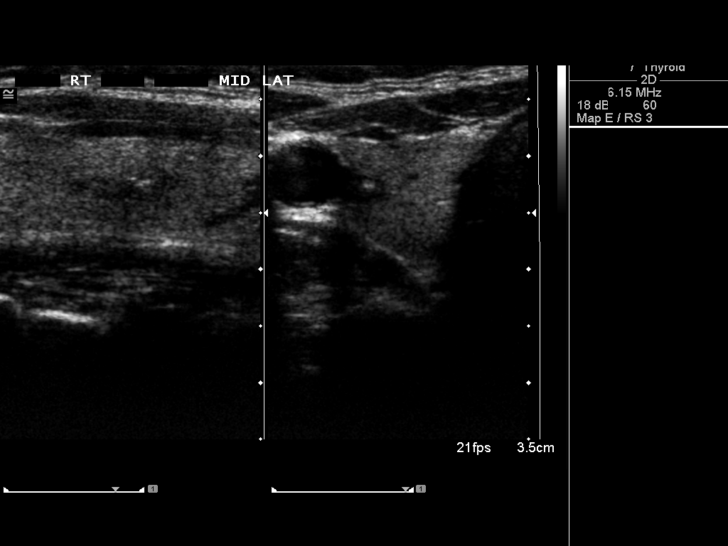
[im 21/61]
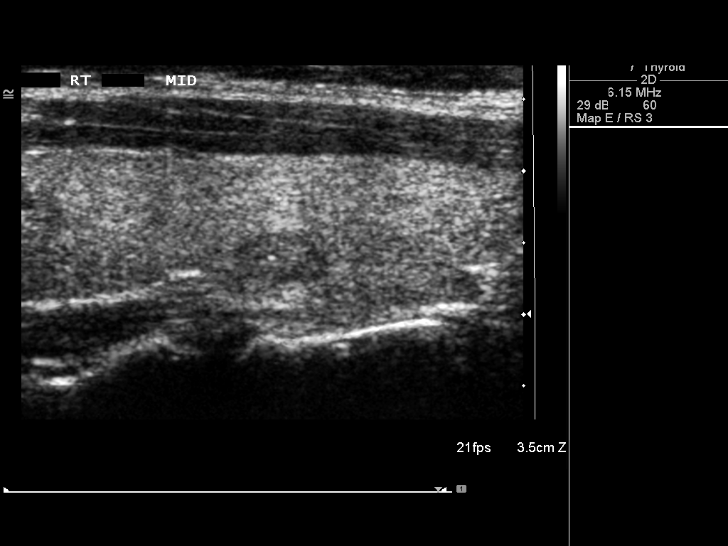
[im 26/61]
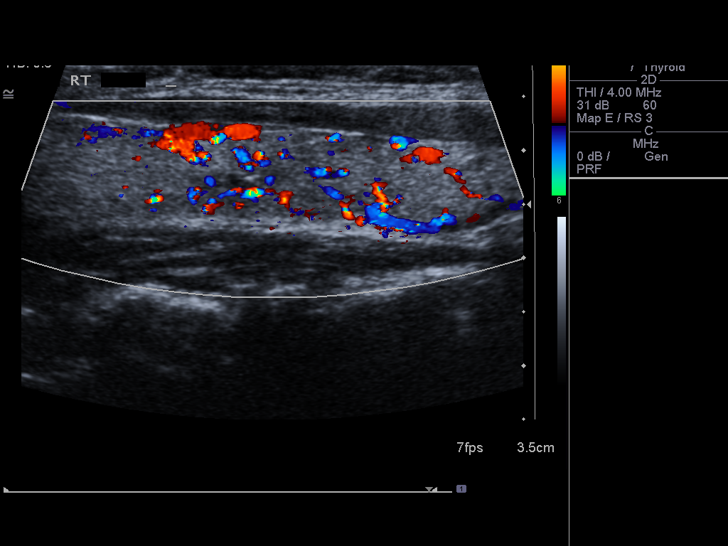
[im 31/61]
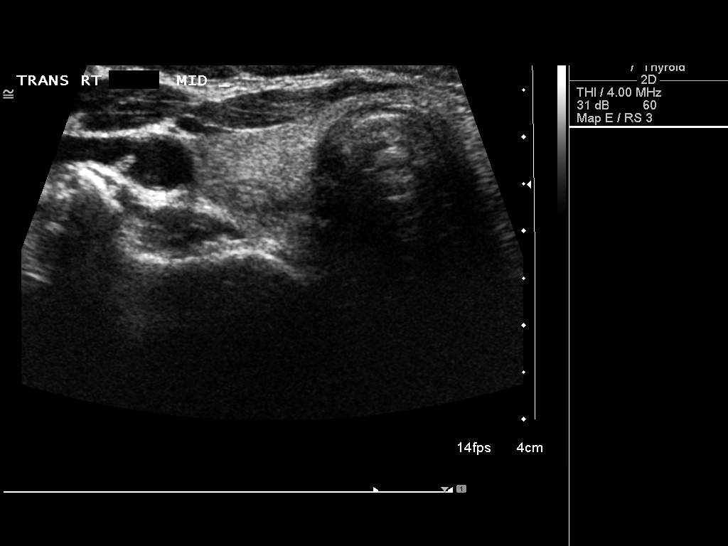
[im 36/61]
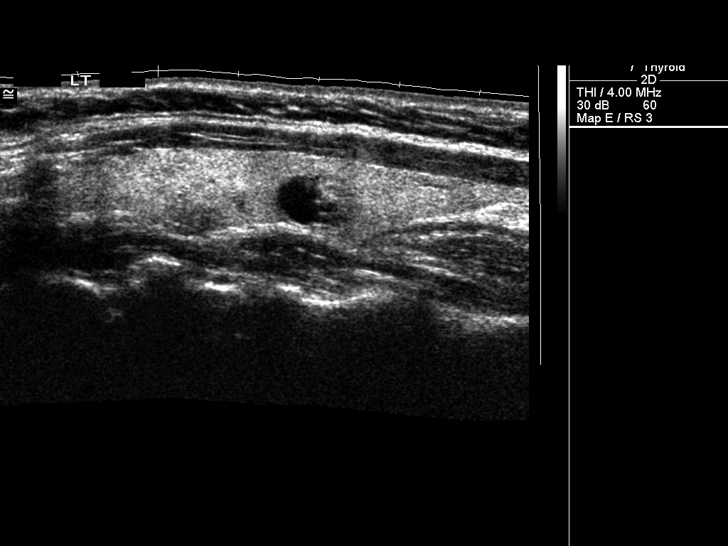
[im 41/61]
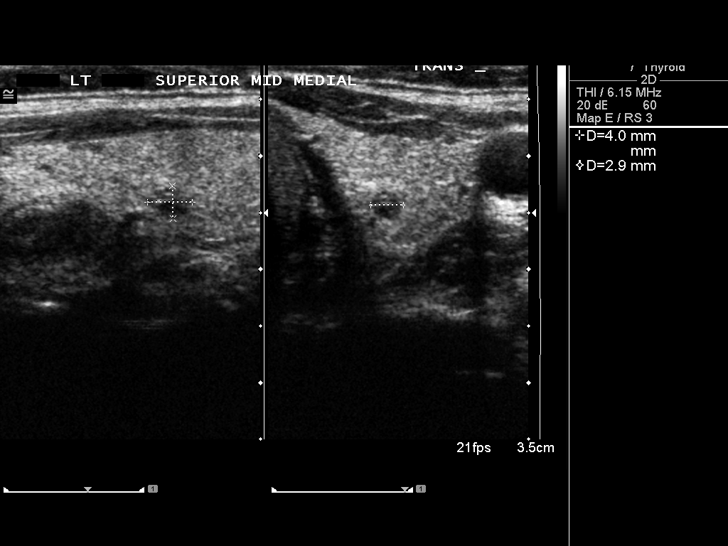
[im 46/61]
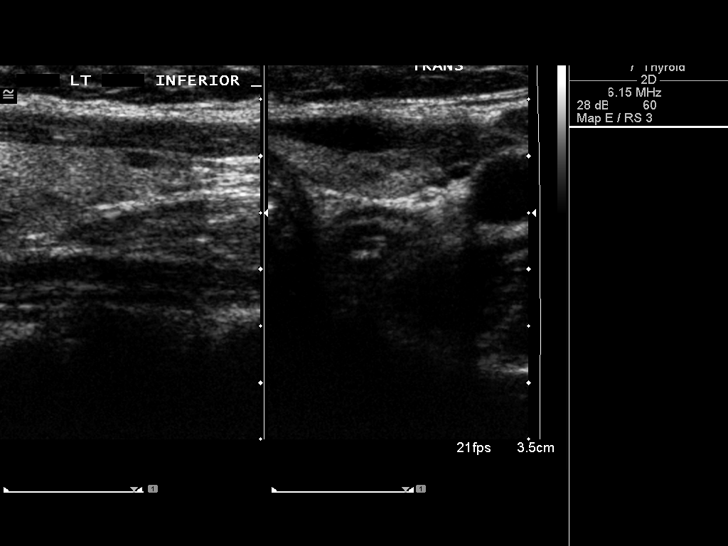
[im 51/61]
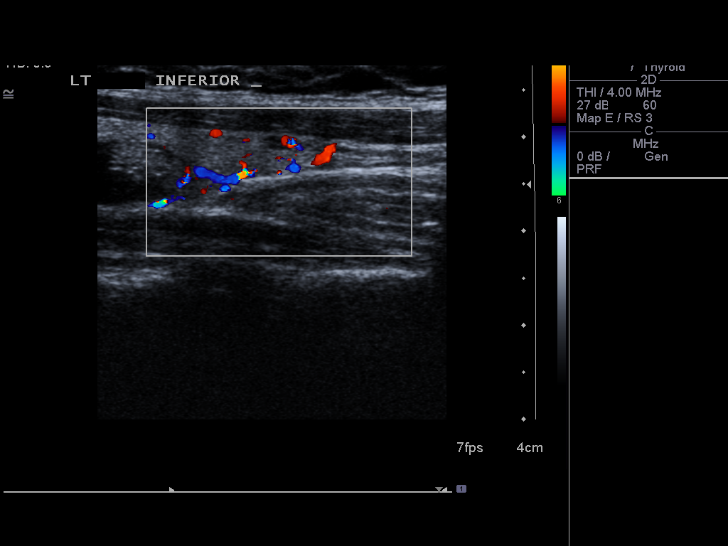
[im 56/61]
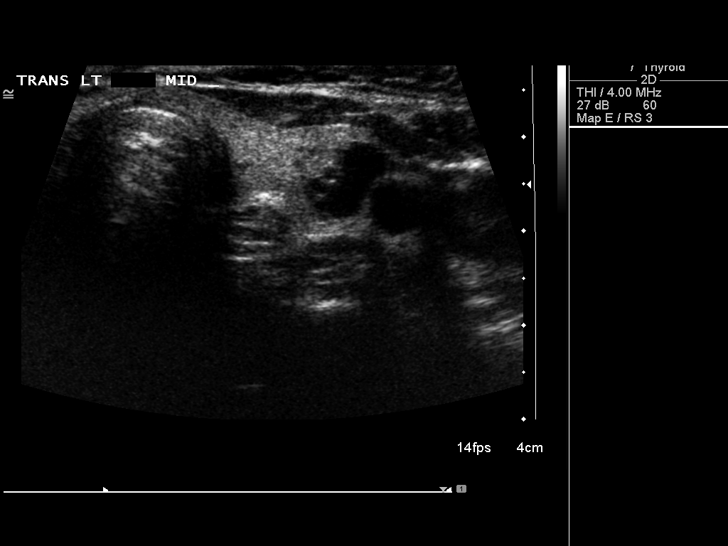
[im 61/61]
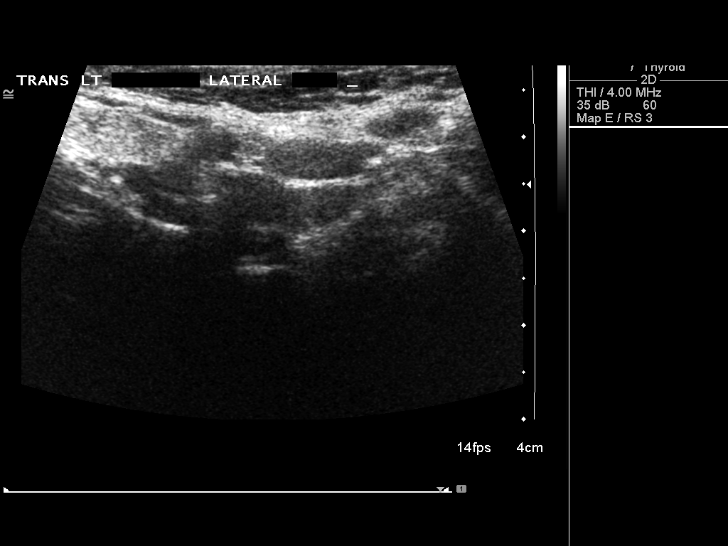

[13 of 25 positions shown; findings below may reference images not displayed]

FINDINGS: Right thyroid lobe:  5.9 x 1.1 x 1.6 cm.  (Previously 6.9 x 1.6 x
1.9 cm).
Left thyroid lobe:  5.8 x 1.2 x 1.8 cm.  (Previously 5.8 x 1.5 x
1.6 cm).
Isthmus:  1.3 mm in thickness.

Focal nodules:  The echogenicity of the thyroid gland is slightly
inhomogeneous. Several thyroid nodules again noted bilaterally. The
dominant nodule is complex within the left mid lobe, both cystic
and solid, measuring 2.1 x 0.8 x 1.1 cm compared to prior
measurements of 1.3 x 0.6 x 0.9 cm.  In view of the interval growth
and the soft tissue component, biopsy of this nodule could be
performed.

The largest nodule on the right is in the mid upper right lobe and
is solid measuring 1.1 x 0.4 x 0.4 cm. Previously, this nodule
measured 1.0 x 0.4 x 0.5 cm. Smaller nodules are present of no more
than 7 mm in diameter.

Lymphadenopathy:  None visualized.
IMPRESSION: Slight increase in size of the complex nodule in the left mid lobe
which does have a soft tissue component.  Recommend biopsy of this
nodule in view of the interval change.

## 2013-05-27 ENCOUNTER — Other Ambulatory Visit: Payer: Self-pay | Admitting: Internal Medicine

## 2013-05-27 DIAGNOSIS — E041 Nontoxic single thyroid nodule: Secondary | ICD-10-CM

## 2013-06-04 ENCOUNTER — Other Ambulatory Visit (HOSPITAL_COMMUNITY)
Admission: RE | Admit: 2013-06-04 | Discharge: 2013-06-04 | Disposition: A | Discharge status: Home / Self Care | Payer: BC Managed Care – PPO | Source: Ambulatory Visit | Attending: Interventional Radiology | Admitting: Interventional Radiology

## 2013-06-04 ENCOUNTER — Ambulatory Visit
Admission: RE | Admit: 2013-06-04 | Discharge: 2013-06-04 | Disposition: A | Discharge status: Home / Self Care | Payer: BC Managed Care – PPO | Source: Ambulatory Visit | Attending: Internal Medicine | Admitting: Internal Medicine

## 2013-06-04 DIAGNOSIS — E041 Nontoxic single thyroid nodule: Secondary | ICD-10-CM

## 2013-06-04 IMAGING — US US THYROID BIOPSY
1 series · 12 of 12 positions shown · non-contrast
Comparison: none

CLINICAL DATA: Enlarging complex cystic left thyroid lesion..

ULTRASOUND-GUIDED THYROID ASPIRATION BIOPSY
TECHNIQUE: Survey ultrasound was performed and the dominant lesion
in the mid-left lobe was localized.  An appropriate skin entry site
was determined.  Skin was marked, then prepped with Betadine,
draped in usual sterile fashion, and infiltrated locally with 1%
lidocaine.  Under real-time ultrasound guidance, 4  passes were
made into the lesion with 25 and 18 gauge needles, sampling solid
and cystic components.  The patient tolerated procedure well, with
no immediate complications.
IMPRESSION
1.  Technically successful ultrasound-guided thyroid aspiration
biopsy

[Series 1: us thyroid biopsy · 0.05mm/px · 12 acquisitions, 12 frames shown]
[im 1/12]
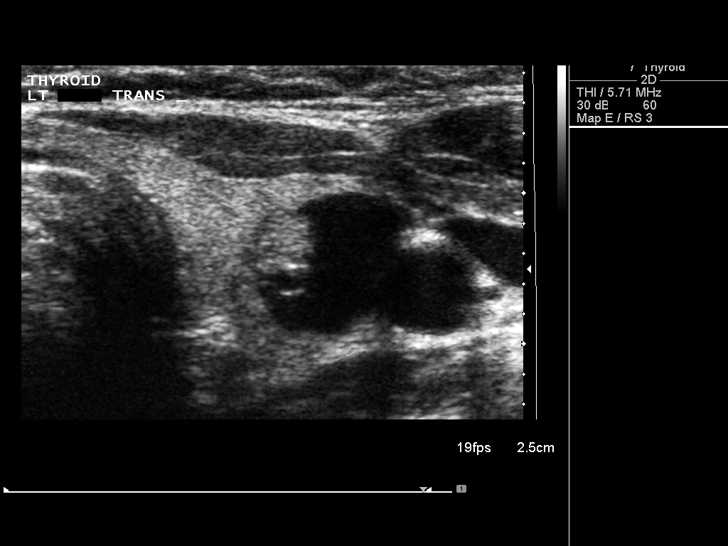
[im 2/12]
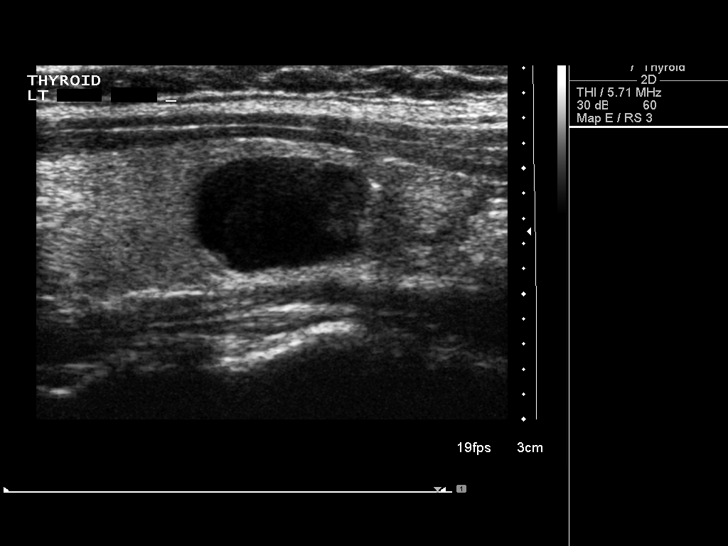
[im 3/12]
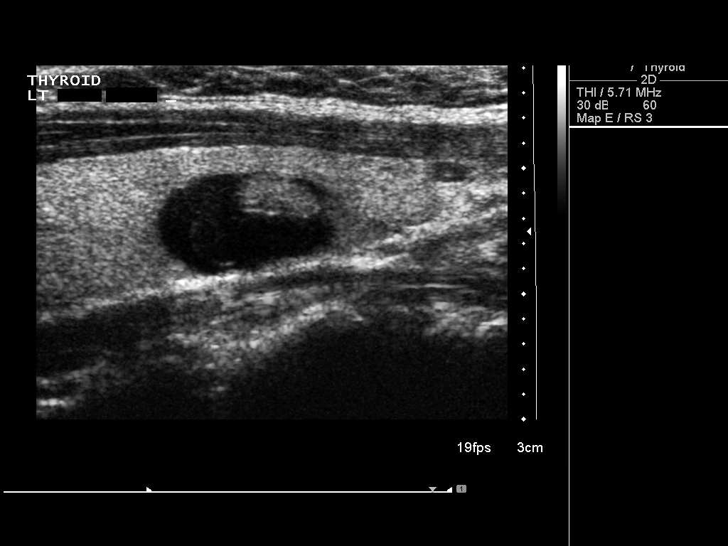
[im 4/12]
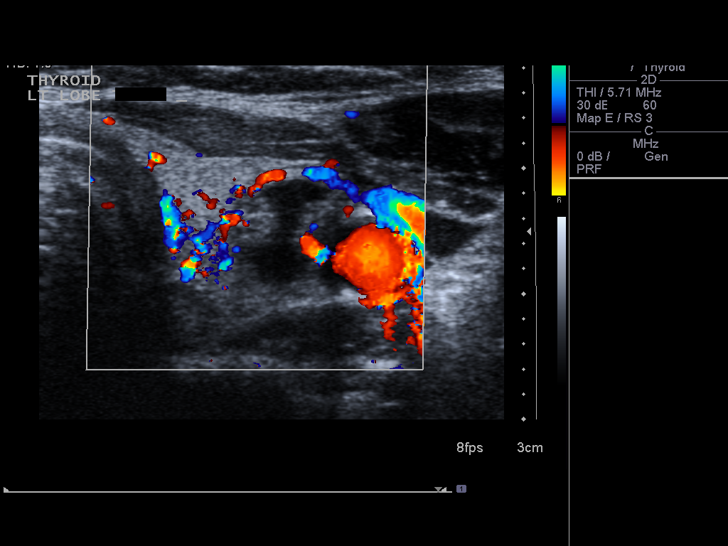
[im 5/12]
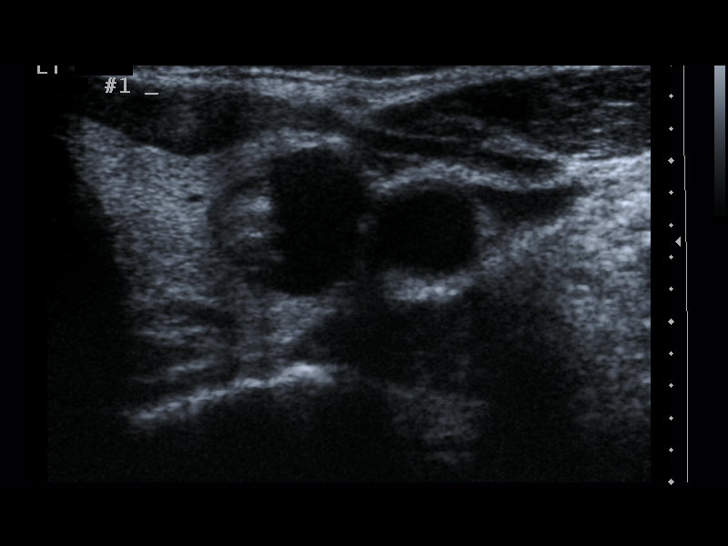
[im 6/12]
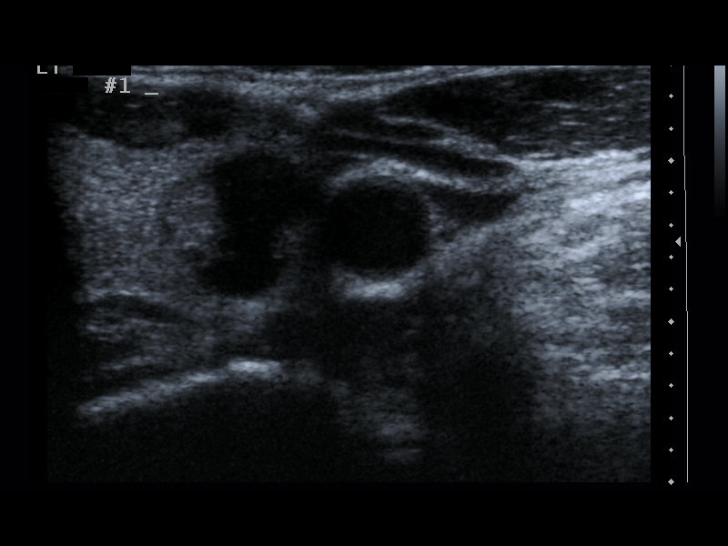
[im 7/12]
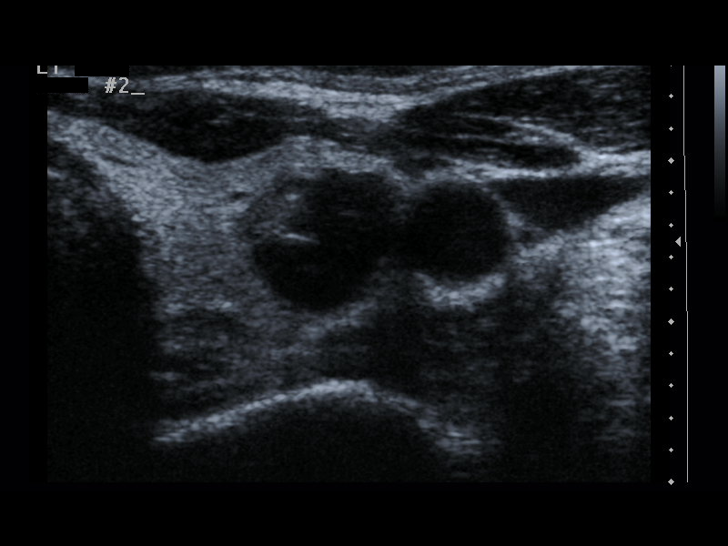
[im 8/12]
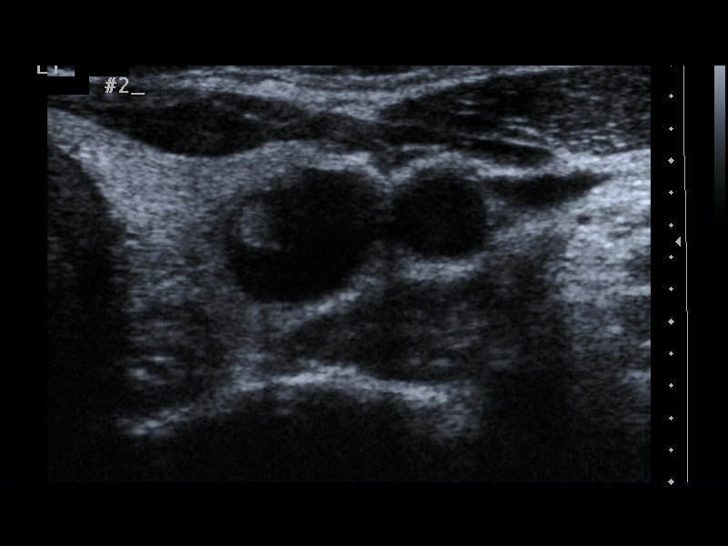
[im 9/12]
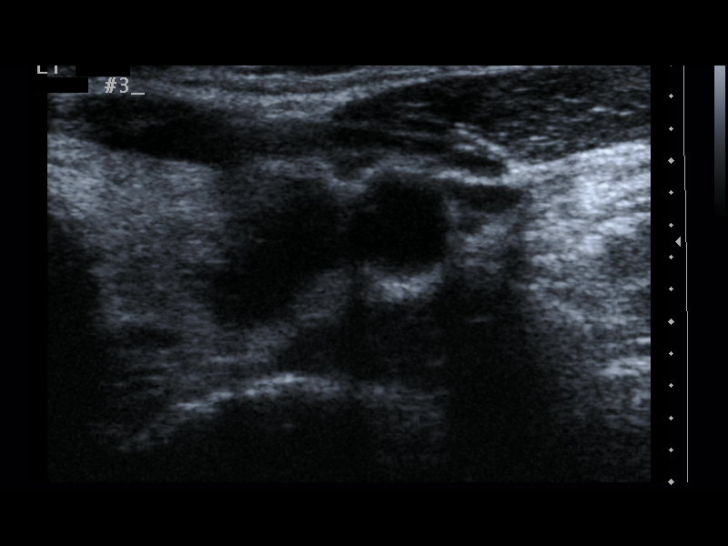
[im 10/12]
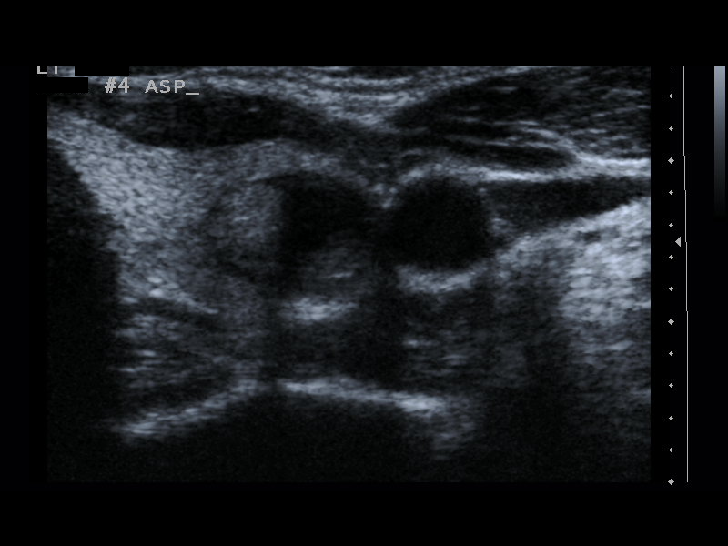
[im 11/12]
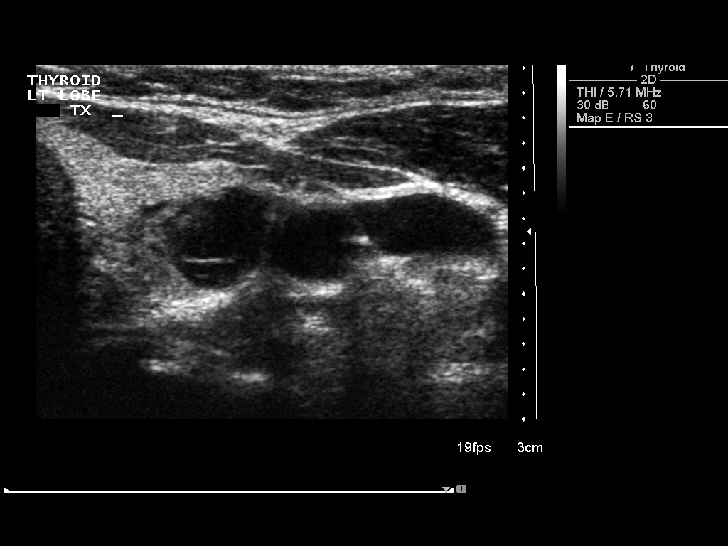
[im 12/12]
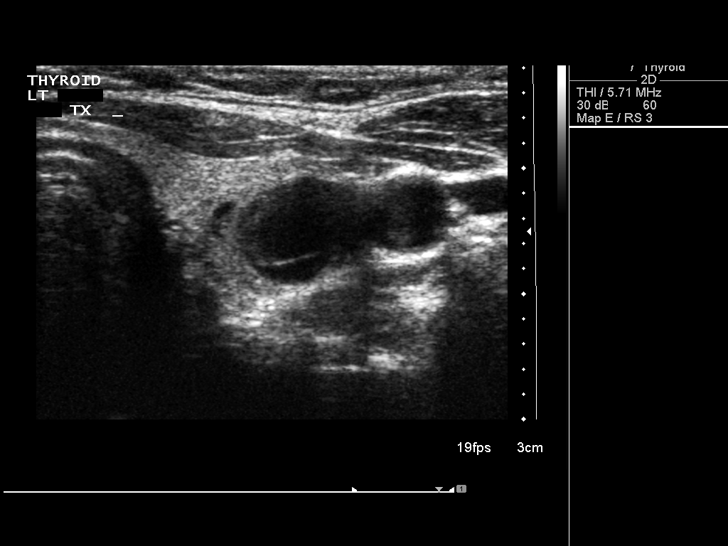

[12 of 12 positions shown; findings below may reference images not displayed]

## 2014-04-09 ENCOUNTER — Other Ambulatory Visit: Payer: Self-pay | Admitting: Internal Medicine

## 2014-04-09 DIAGNOSIS — E049 Nontoxic goiter, unspecified: Secondary | ICD-10-CM

## 2014-04-16 ENCOUNTER — Ambulatory Visit
Admission: RE | Admit: 2014-04-16 | Discharge: 2014-04-16 | Disposition: A | Discharge status: Home / Self Care | Payer: BC Managed Care – PPO | Source: Ambulatory Visit | Attending: Internal Medicine | Admitting: Internal Medicine

## 2014-04-16 DIAGNOSIS — E049 Nontoxic goiter, unspecified: Secondary | ICD-10-CM

## 2014-04-16 IMAGING — US US SOFT TISSUE HEAD/NECK
1 series · 13 of 25 positions shown · non-contrast
Comparison: Thyroid ultrasound- [DATE]; [DATE] ;
ultrasound-guided left thyroid nodule biopsy - [DATE]

CLINICAL DATA: Goiter

EXAM:
THYROID ULTRASOUND
TECHNIQUE: Ultrasound examination of the thyroid gland and adjacent soft
tissues was performed.

[Series 1: us soft tissue head/neck · 0.07mm/px · 13 of 42 slices shown]
[im 1/42]
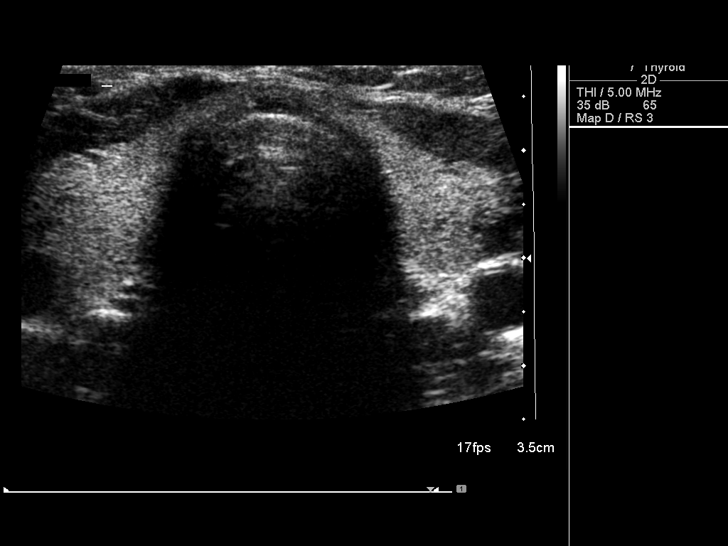
[im 4/42]
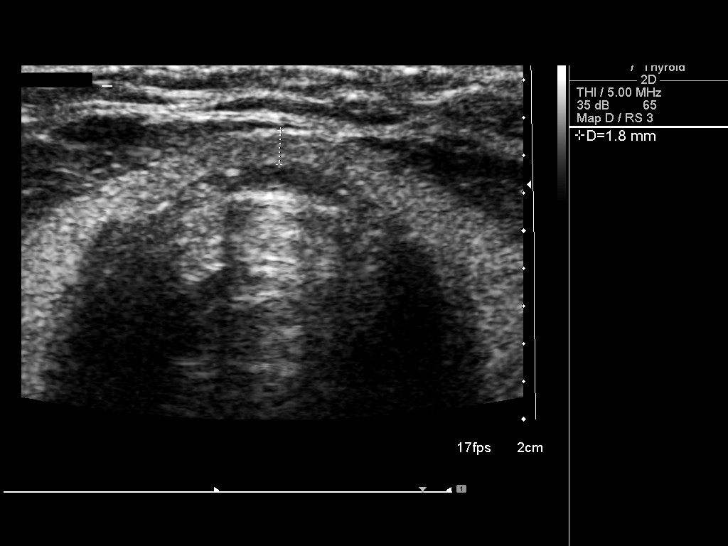
[im 7/42]
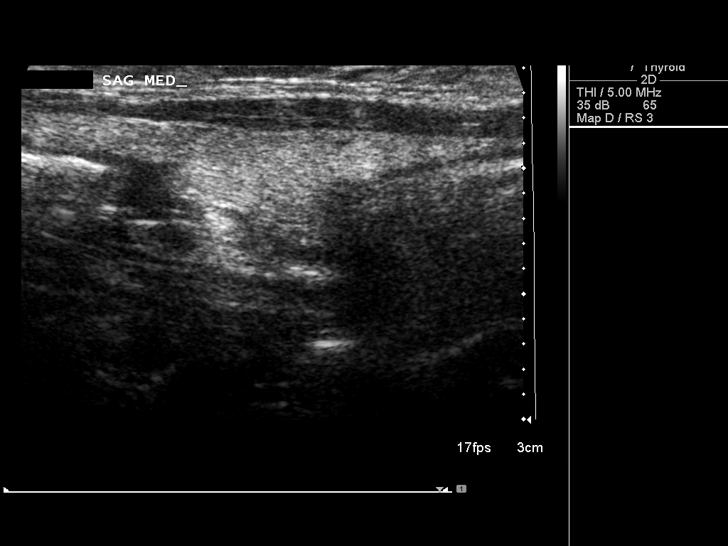
[im 11/42]
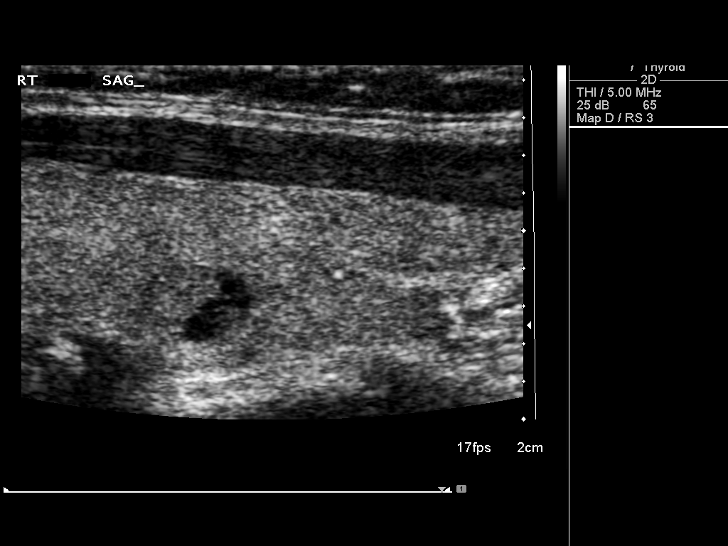
[im 14/42]
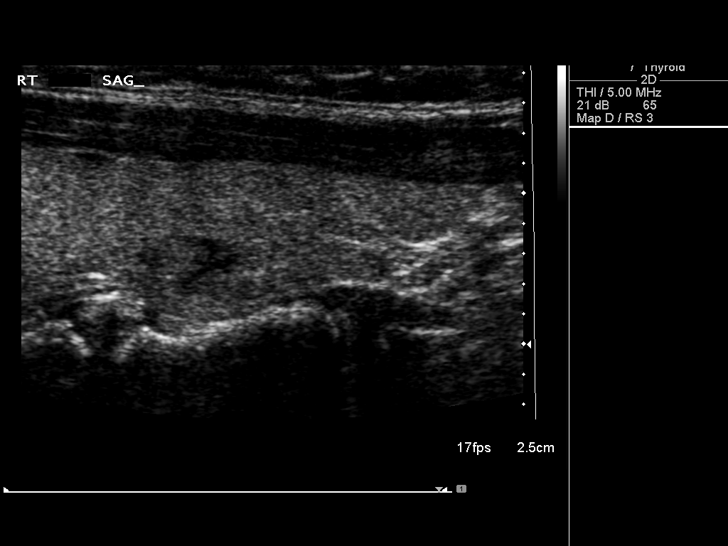
[im 18/42]
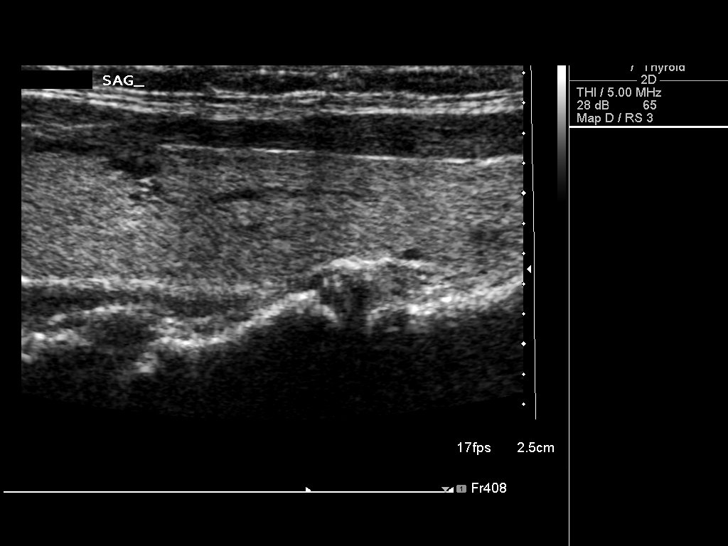
[im 21/42]
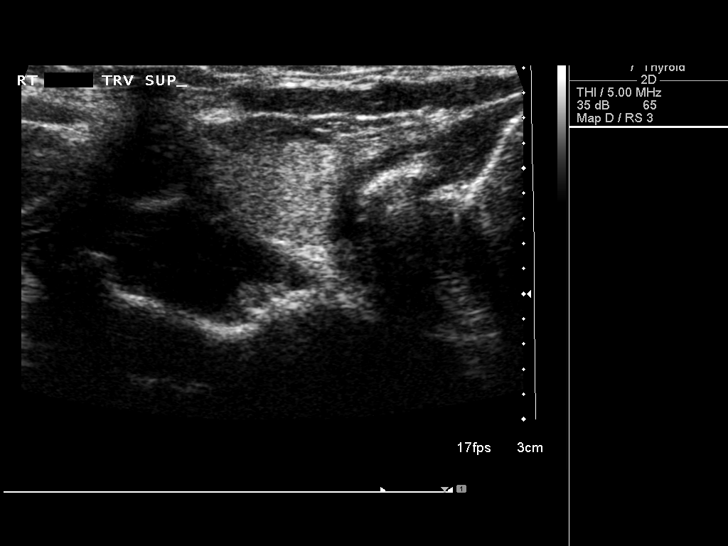
[im 24/42]
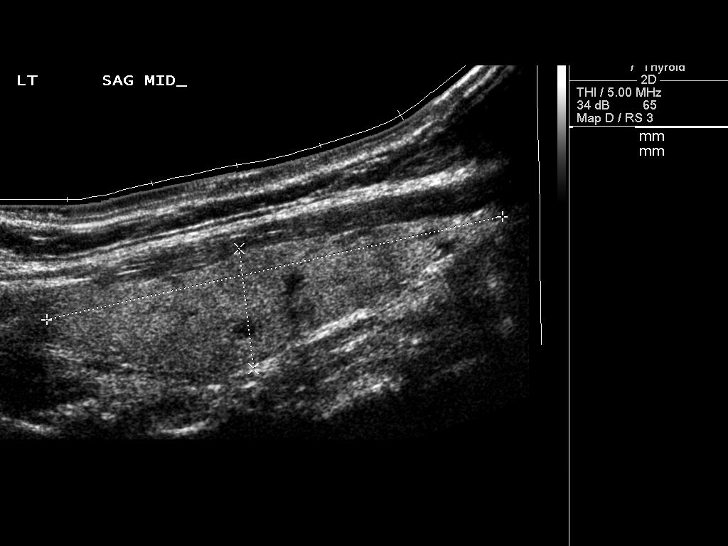
[im 28/42]
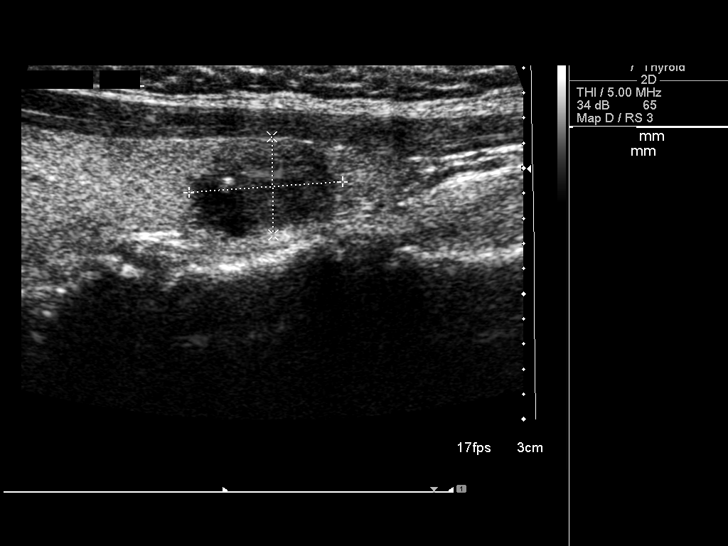
[im 31/42]
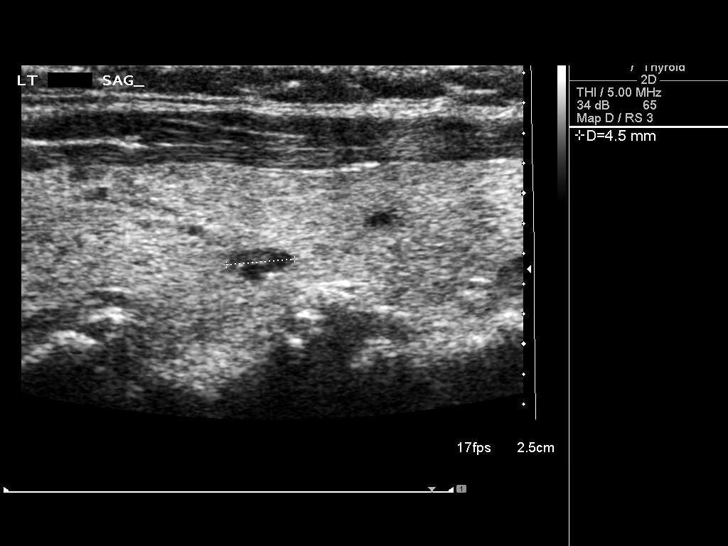
[im 35/42]
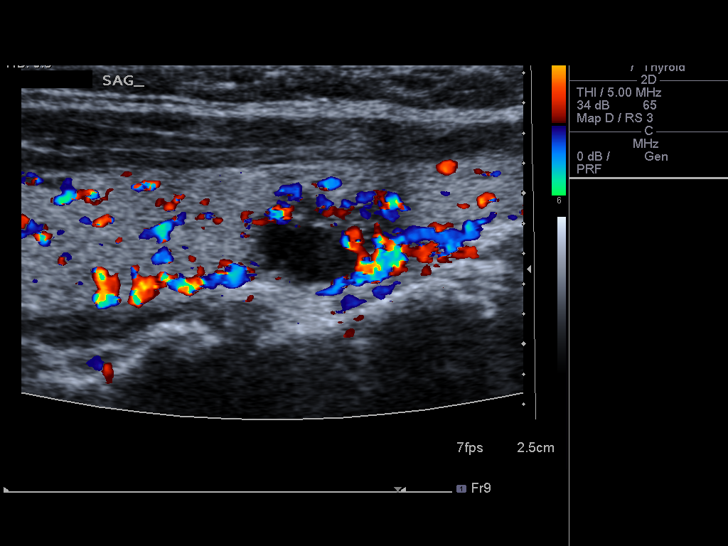
[im 38/42]
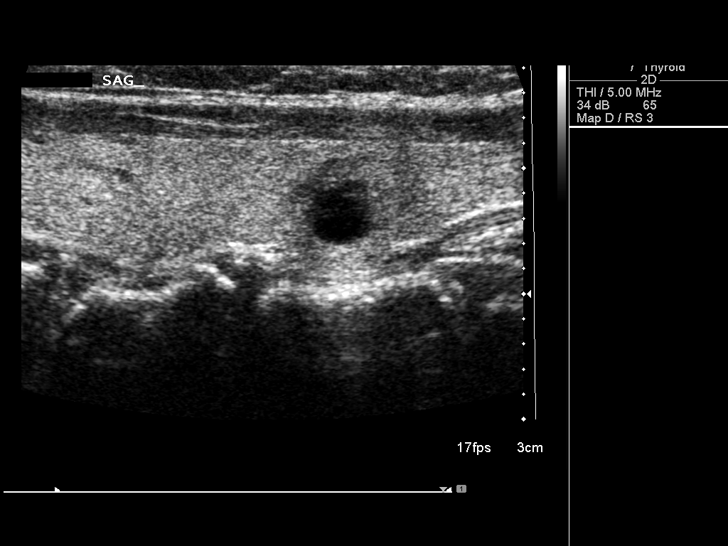
[im 42/42]
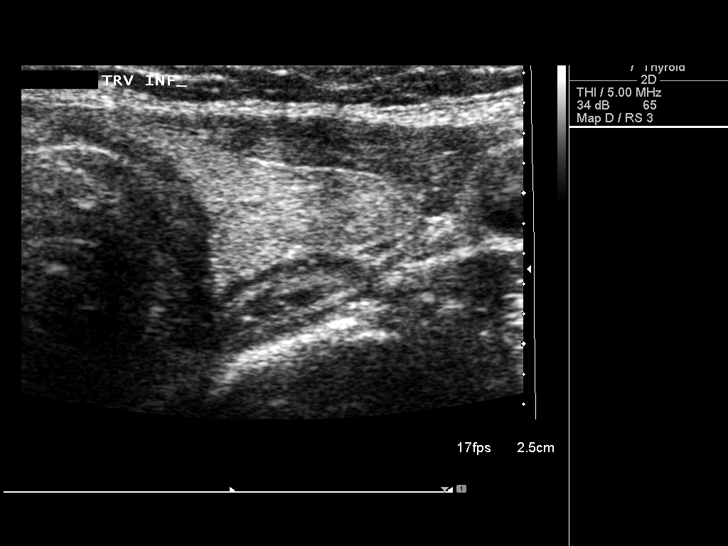

[13 of 25 positions shown; findings below may reference images not displayed]

FINDINGS: There is mild diffuse heterogeneity of thyroid parenchymal
echotexture. No new discrete solid thyroid nodules.

Right thyroid lobe

Measurements: Borderline enlarged measuring 6.0 x 1.4 x 2.0 cm,
unchanged, previously, 5.9 x 1.1 x 1.6 cm..

Right, superior - 1.0 x 0.4 x 0.4 cm - mixed echogenic, ill-defined,
solid, unchanged, previously, 1.1 x 0.4 x 0.4 cm.

Right, mid - 0.4 x 0.6 x 0.5 cm - mixed echogenic, partially cystic,
predominantly solid, unchanged, previously, 0.5 x 0.3 x 0.5 cm.

Right, inferior - 0.2 cm - anechoic, likely cystic, not discretely
measured on the prior examination.

Left thyroid lobe

Measurements: Borderline enlarged measuring 5.4 x 1.4 x 1.6 cm,
unchanged, previously, 5.8 x 1.2 x 1.8 cm.

Left, mid, inferior- 1.2 x 0.8 x 0.7 cm - mixed echogenic, partially
cystic, partially solid with internal punctate echogenic foci with
ring down artifact suggestive of colloid - decreased in size in the
interval, previously, 1.8 x 0.8 x 1.1 cm - this nodule was
previously biopsied.

Left, mid - 0.5 cm anechoic with peripheral echogenic nodule with
ring down artifact suggestive of colloid - unchanged.

Left, mid - 0.2 cm - anechoic, cystic - unchanged

Isthmus

Thickness: Normal in size measuring 0.2 cm in diameter..

No discrete nodules are identified within the thyroid isthmus.

Lymphadenopathy

None visualized.
IMPRESSION: Grossly unchanged findings compatible with multi nodular goiter. The
previously biopsied dominant now approximately 1.2 cm nodule within
the mid/inferior aspect of the left lobe of the thyroid has
decreased in size in interval, previously 1.8 cm in diameter.
Correlation with prior biopsy results is recommended. No new
discrete solid or enlarging thyroid nodules.

## 2015-02-20 ENCOUNTER — Ambulatory Visit
Admission: RE | Admit: 2015-02-20 | Discharge: 2015-02-20 | Disposition: A | Discharge status: Home / Self Care | Payer: Self-pay | Source: Ambulatory Visit | Attending: Family Medicine | Admitting: Family Medicine

## 2015-02-20 ENCOUNTER — Other Ambulatory Visit: Payer: Self-pay | Admitting: Family Medicine

## 2015-02-20 DIAGNOSIS — M25551 Pain in right hip: Secondary | ICD-10-CM

## 2015-02-20 IMAGING — CR DG HIP (WITH OR WITHOUT PELVIS) 2-3V*R*
2 series · 2 of 2 positions shown · non-contrast
Comparison: None.

CLINICAL DATA: Chronic anterior right hip pain with worsening last
2 months, no known injury

EXAM:
RIGHT HIP (WITH PELVIS) 2-3 VIEWS

[w pelvis * (1 of 2)]
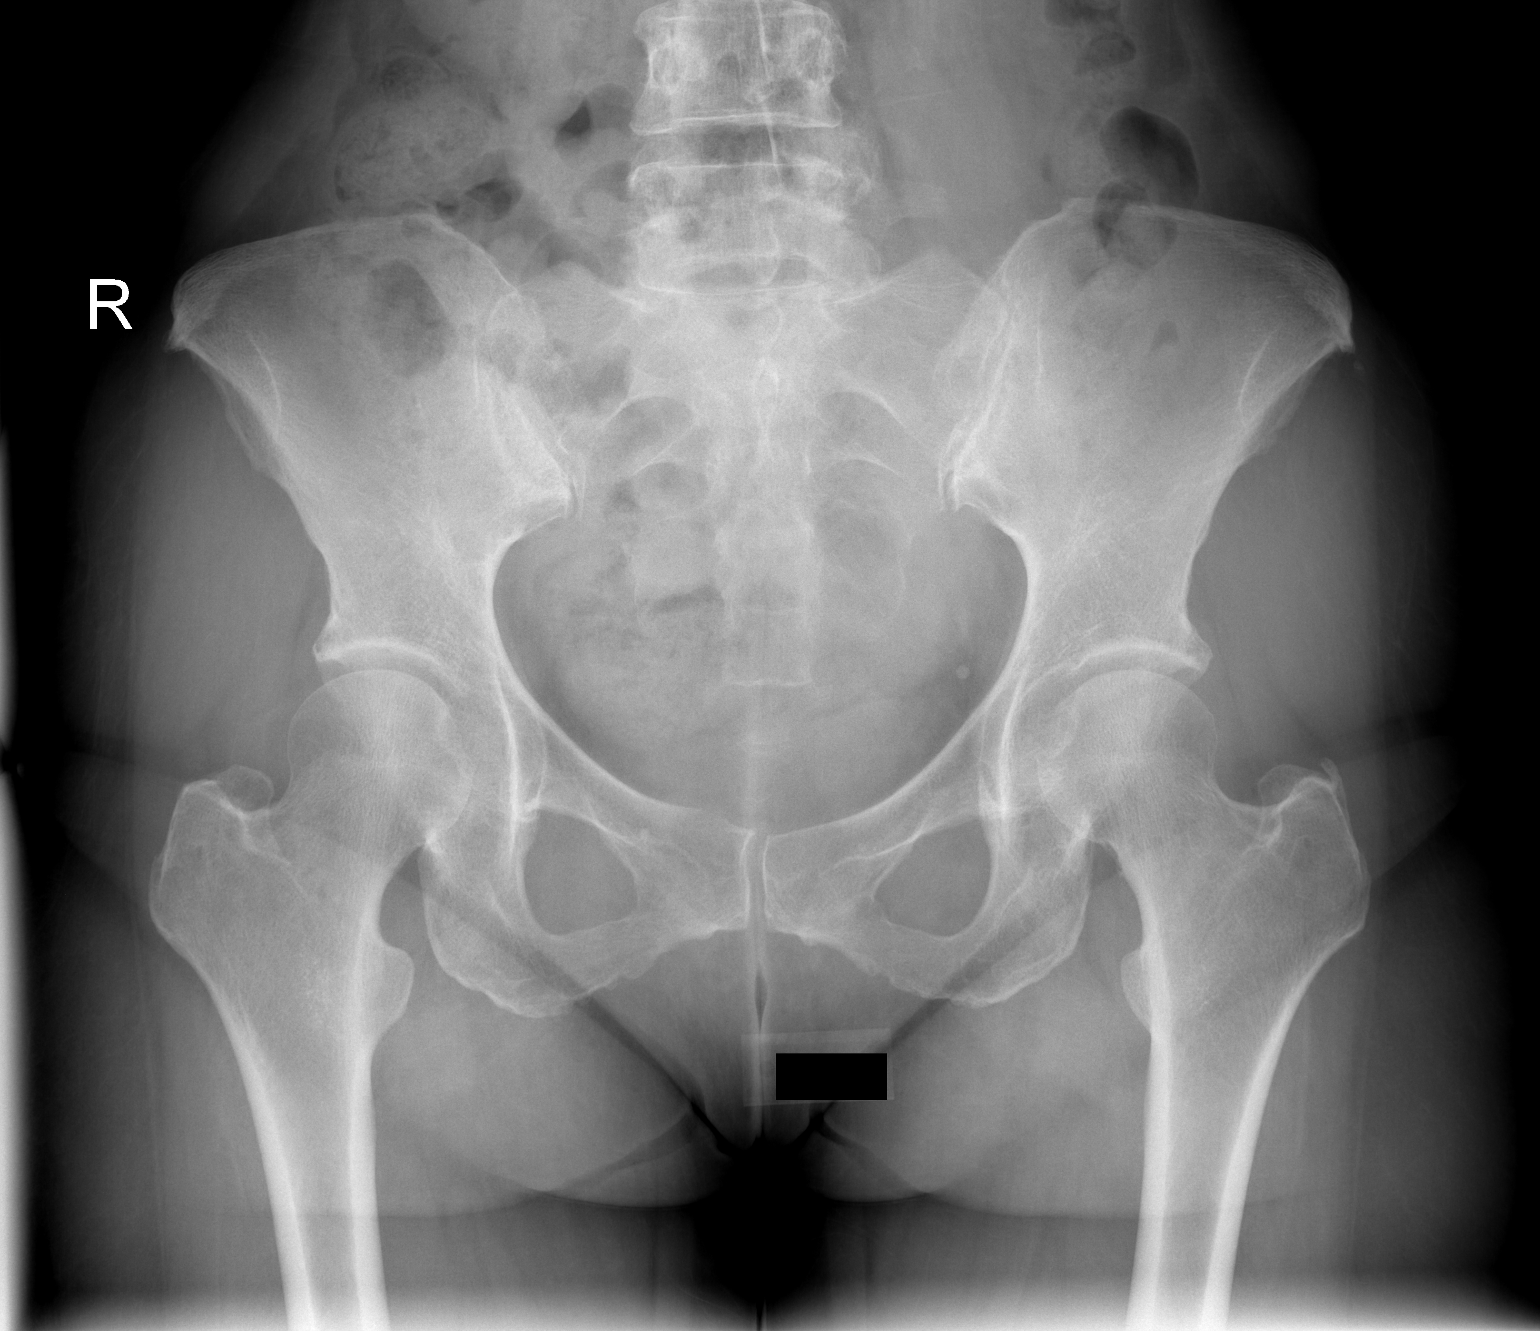

[w pelvis * (2 of 2)]
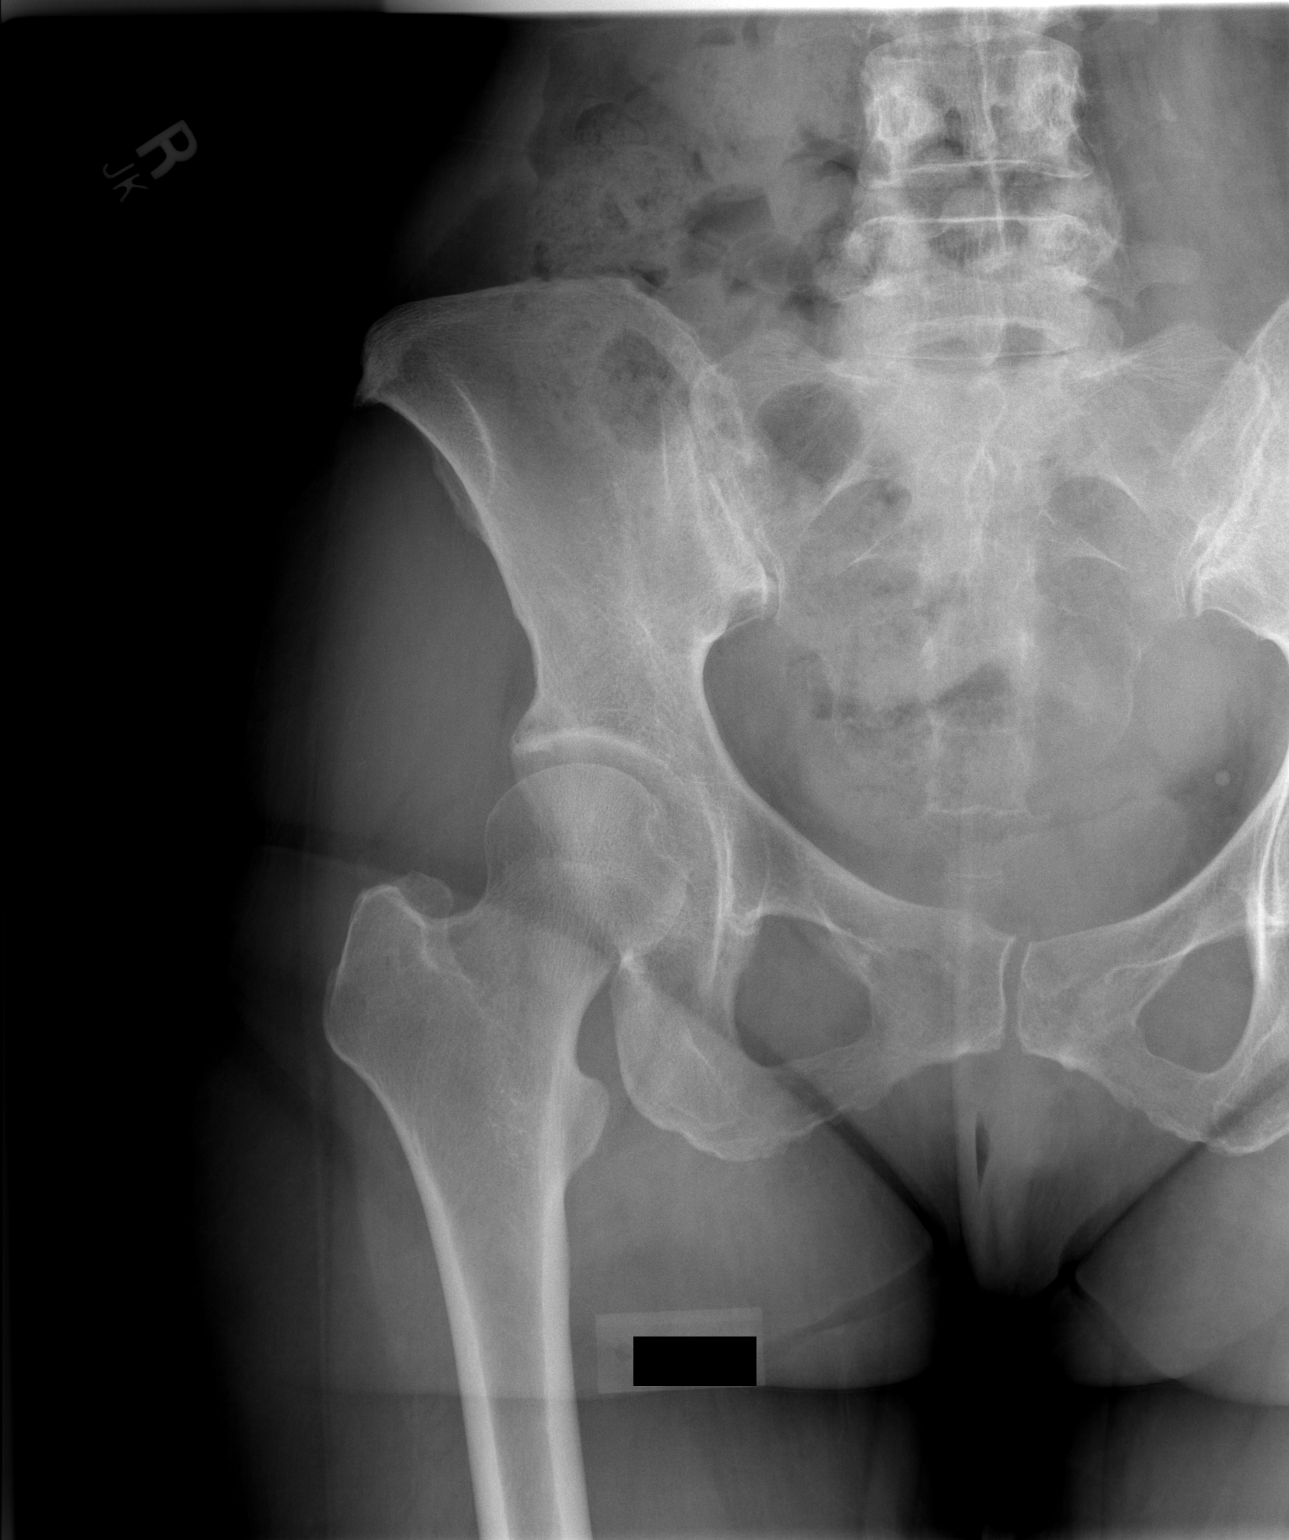

[2 of 2 positions shown; findings below may reference images not displayed]

FINDINGS: Three views of the right hip submitted. No acute fracture or
subluxation. Bilateral hip joints are symmetrical in appearance.
Mild degenerative changes pubic symphysis.
IMPRESSION: No acute fracture or subluxation. Mild degenerative changes pubic
symphysis.

## 2015-02-20 IMAGING — CR DG HIP (WITH OR WITHOUT PELVIS) 2-3V*R*
1 series · 1 of 1 positions shown · non-contrast
Comparison: None.

CLINICAL DATA: Chronic anterior right hip pain with worsening last
2 months, no known injury

EXAM:
RIGHT HIP (WITH PELVIS) 2-3 VIEWS

[t hip ap right]
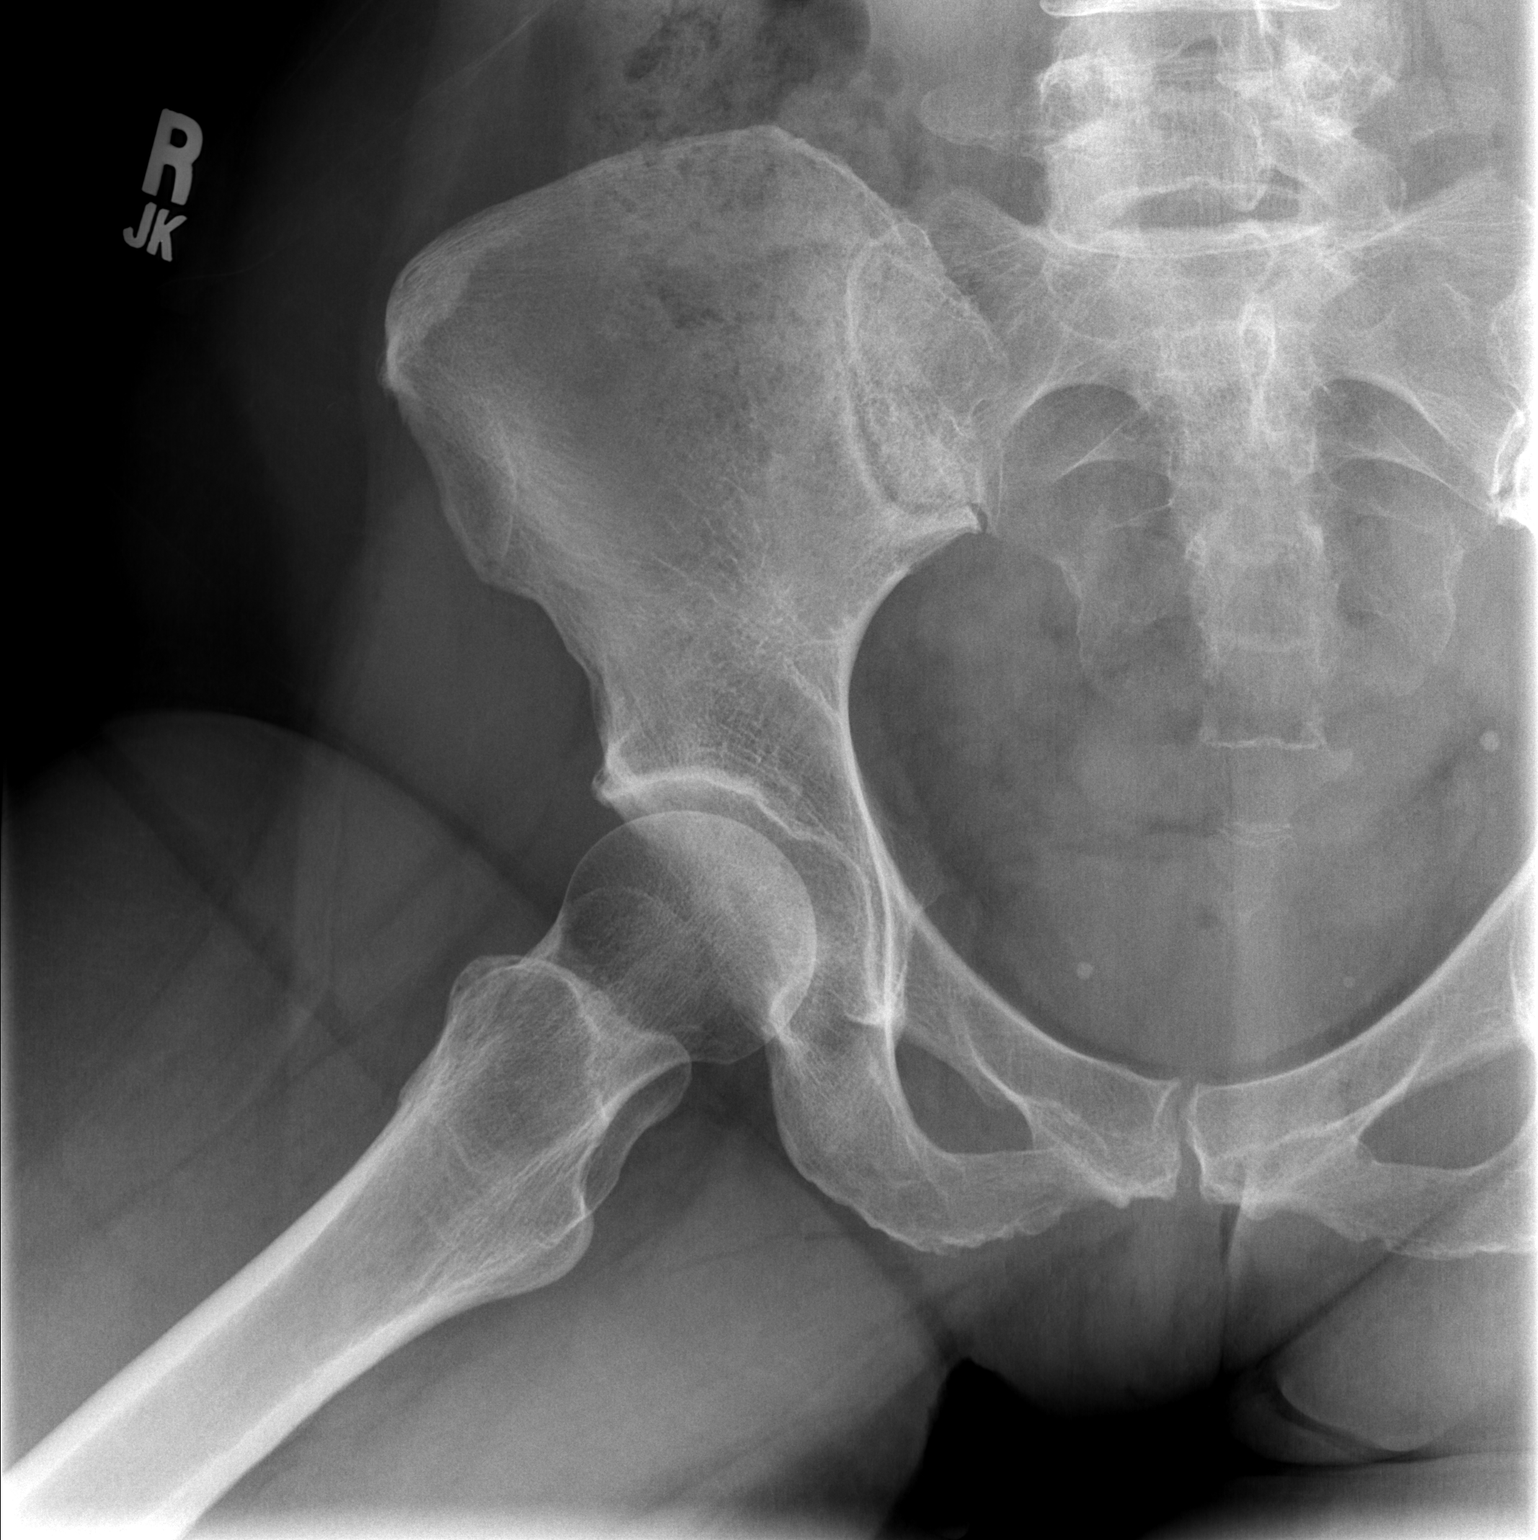

[1 of 1 positions shown; findings below may reference images not displayed]

FINDINGS: Three views of the right hip submitted. No acute fracture or
subluxation. Bilateral hip joints are symmetrical in appearance.
Mild degenerative changes pubic symphysis.
IMPRESSION: No acute fracture or subluxation. Mild degenerative changes pubic
symphysis.

## 2016-02-03 DIAGNOSIS — I10 Essential (primary) hypertension: Secondary | ICD-10-CM | POA: Diagnosis not present

## 2016-02-03 DIAGNOSIS — M797 Fibromyalgia: Secondary | ICD-10-CM | POA: Diagnosis not present

## 2016-02-03 DIAGNOSIS — M25511 Pain in right shoulder: Secondary | ICD-10-CM | POA: Diagnosis not present

## 2016-02-08 DIAGNOSIS — M67911 Unspecified disorder of synovium and tendon, right shoulder: Secondary | ICD-10-CM | POA: Diagnosis not present

## 2016-02-08 DIAGNOSIS — M7521 Bicipital tendinitis, right shoulder: Secondary | ICD-10-CM | POA: Diagnosis not present

## 2016-03-02 DIAGNOSIS — M5441 Lumbago with sciatica, right side: Secondary | ICD-10-CM | POA: Diagnosis not present

## 2016-03-02 DIAGNOSIS — R51 Headache: Secondary | ICD-10-CM | POA: Diagnosis not present

## 2016-03-02 DIAGNOSIS — M546 Pain in thoracic spine: Secondary | ICD-10-CM | POA: Diagnosis not present

## 2016-03-02 DIAGNOSIS — M542 Cervicalgia: Secondary | ICD-10-CM | POA: Diagnosis not present

## 2016-03-03 DIAGNOSIS — M25511 Pain in right shoulder: Secondary | ICD-10-CM | POA: Diagnosis not present

## 2016-03-15 DIAGNOSIS — M25511 Pain in right shoulder: Secondary | ICD-10-CM | POA: Diagnosis not present

## 2016-03-17 DIAGNOSIS — M67911 Unspecified disorder of synovium and tendon, right shoulder: Secondary | ICD-10-CM | POA: Diagnosis not present

## 2016-03-17 DIAGNOSIS — M24811 Other specific joint derangements of right shoulder, not elsewhere classified: Secondary | ICD-10-CM | POA: Diagnosis not present

## 2016-03-17 DIAGNOSIS — M19211 Secondary osteoarthritis, right shoulder: Secondary | ICD-10-CM | POA: Diagnosis not present

## 2016-03-24 DIAGNOSIS — M25511 Pain in right shoulder: Secondary | ICD-10-CM | POA: Diagnosis not present

## 2016-04-06 DIAGNOSIS — M25511 Pain in right shoulder: Secondary | ICD-10-CM | POA: Diagnosis not present

## 2016-04-08 DIAGNOSIS — Z1151 Encounter for screening for human papillomavirus (HPV): Secondary | ICD-10-CM | POA: Diagnosis not present

## 2016-04-08 DIAGNOSIS — Z01419 Encounter for gynecological examination (general) (routine) without abnormal findings: Secondary | ICD-10-CM | POA: Diagnosis not present

## 2016-04-08 DIAGNOSIS — Z6828 Body mass index (BMI) 28.0-28.9, adult: Secondary | ICD-10-CM | POA: Diagnosis not present

## 2016-04-14 DIAGNOSIS — M25511 Pain in right shoulder: Secondary | ICD-10-CM | POA: Diagnosis not present

## 2016-04-18 DIAGNOSIS — M25511 Pain in right shoulder: Secondary | ICD-10-CM | POA: Diagnosis not present

## 2016-04-21 DIAGNOSIS — M25511 Pain in right shoulder: Secondary | ICD-10-CM | POA: Diagnosis not present

## 2016-05-24 DIAGNOSIS — M25511 Pain in right shoulder: Secondary | ICD-10-CM | POA: Diagnosis not present

## 2016-05-31 DIAGNOSIS — M25511 Pain in right shoulder: Secondary | ICD-10-CM | POA: Diagnosis not present

## 2016-06-02 DIAGNOSIS — E042 Nontoxic multinodular goiter: Secondary | ICD-10-CM | POA: Diagnosis not present

## 2016-08-03 DIAGNOSIS — M797 Fibromyalgia: Secondary | ICD-10-CM | POA: Diagnosis not present

## 2016-08-03 DIAGNOSIS — Z23 Encounter for immunization: Secondary | ICD-10-CM | POA: Diagnosis not present

## 2016-08-03 DIAGNOSIS — I10 Essential (primary) hypertension: Secondary | ICD-10-CM | POA: Diagnosis not present

## 2016-08-03 DIAGNOSIS — G43909 Migraine, unspecified, not intractable, without status migrainosus: Secondary | ICD-10-CM | POA: Diagnosis not present

## 2016-08-15 DIAGNOSIS — H26492 Other secondary cataract, left eye: Secondary | ICD-10-CM | POA: Diagnosis not present

## 2016-08-15 DIAGNOSIS — Z961 Presence of intraocular lens: Secondary | ICD-10-CM | POA: Diagnosis not present

## 2016-08-30 DIAGNOSIS — Z1231 Encounter for screening mammogram for malignant neoplasm of breast: Secondary | ICD-10-CM | POA: Diagnosis not present

## 2016-10-12 IMAGING — US US THYROID
1 series · 12 of 25 positions shown · non-contrast
Comparison: [DATE];

CLINICAL DATA: Goiter. History of right-sided thyroid nodule biopsy
on [DATE] and left-sided thyroid nodule biopsy on [DATE].

EXAM:
THYROID ULTRASOUND
TECHNIQUE: Ultrasound examination of the thyroid gland and adjacent soft
tissues was performed.

[Series 1: us thyroid · 0.05mm/px · 12 of 53 slices shown]
[im 3/53]
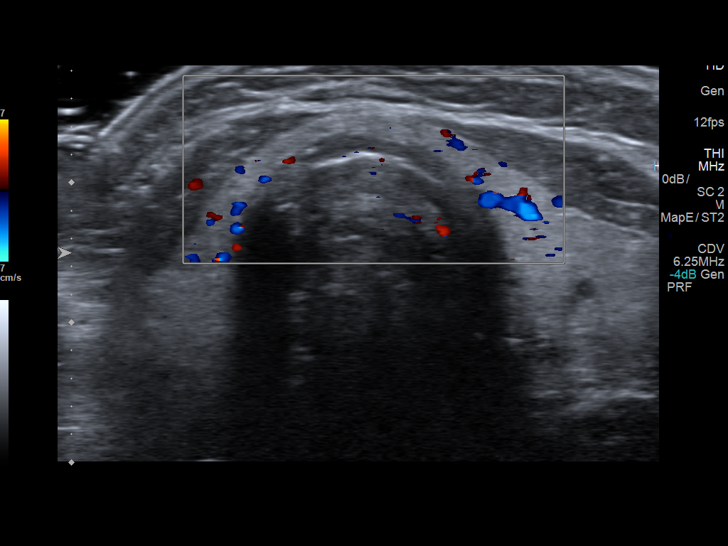
[im 7/53]
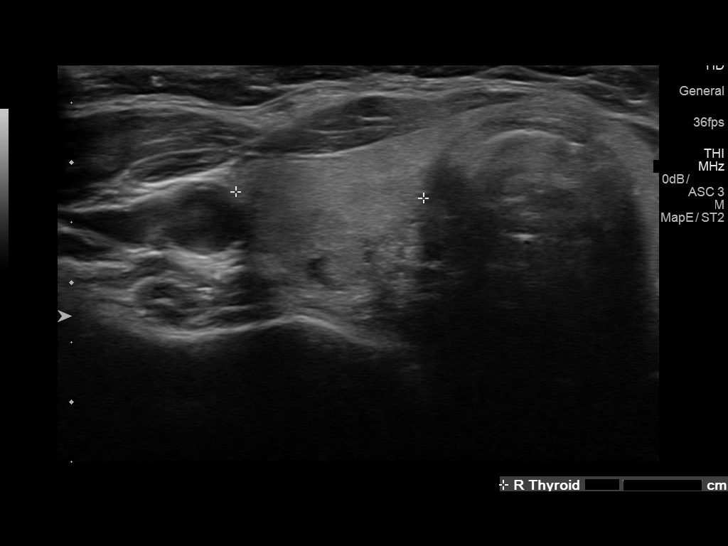
[im 11/53]
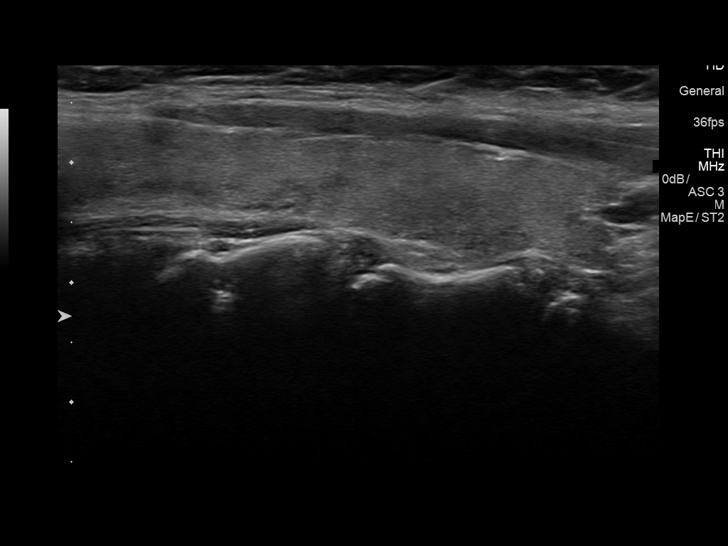
[im 16/53]
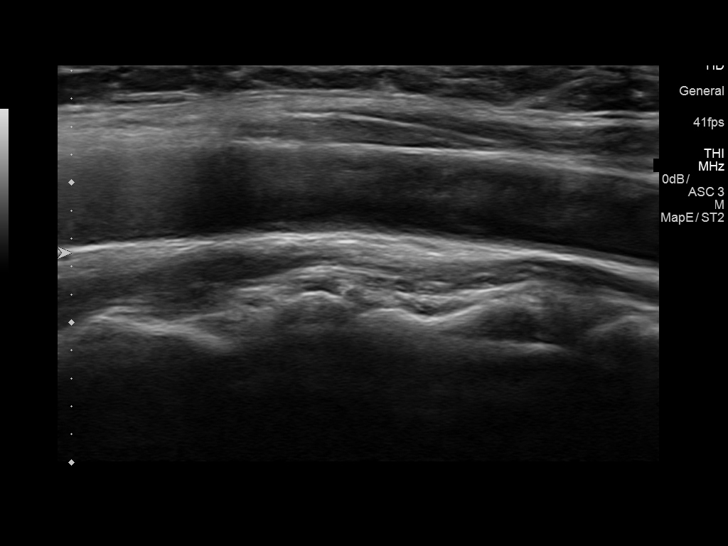
[im 20/53]
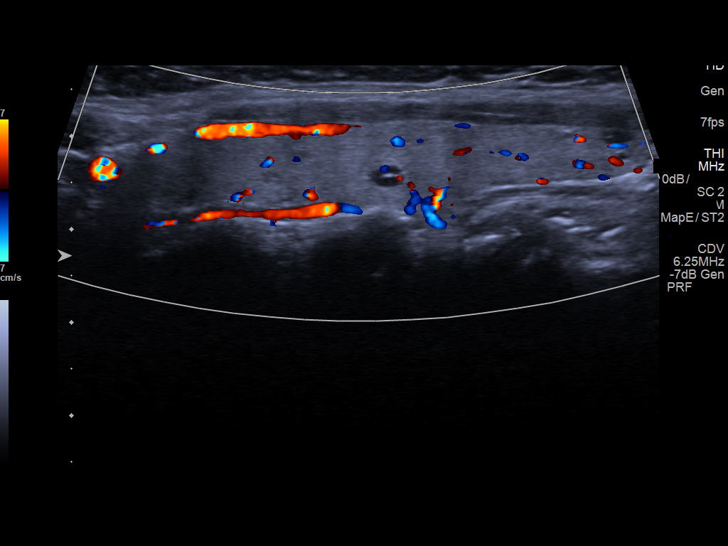
[im 24/53]
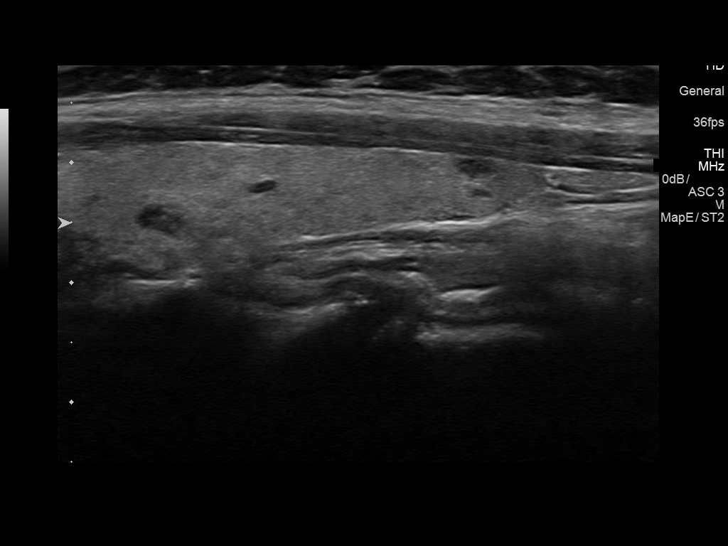
[im 29/53]
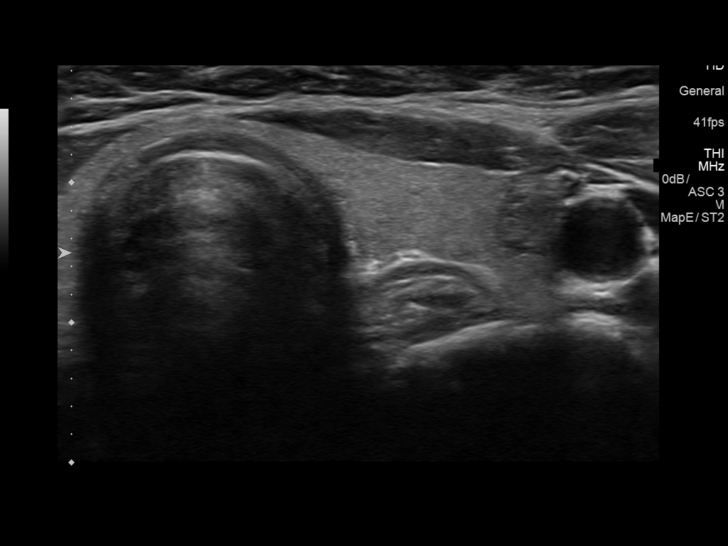
[im 33/53]
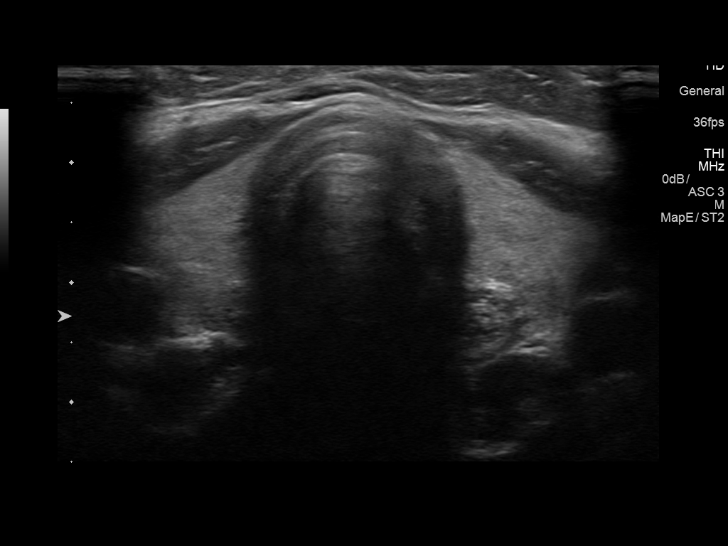
[im 37/53]
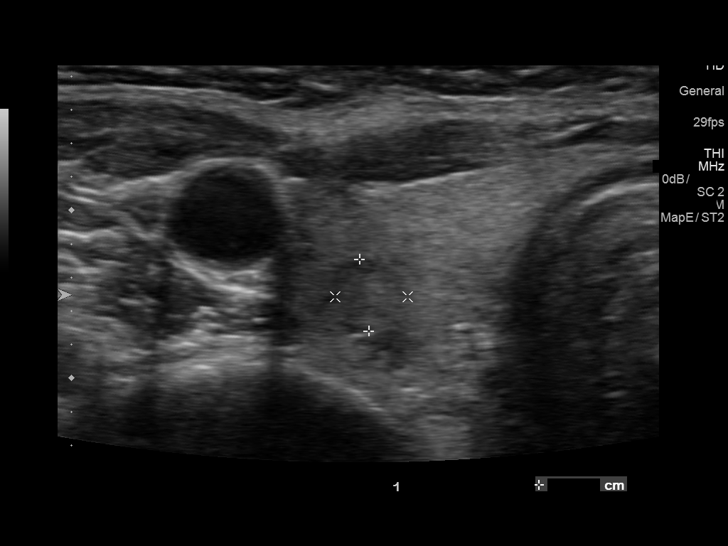
[im 42/53]
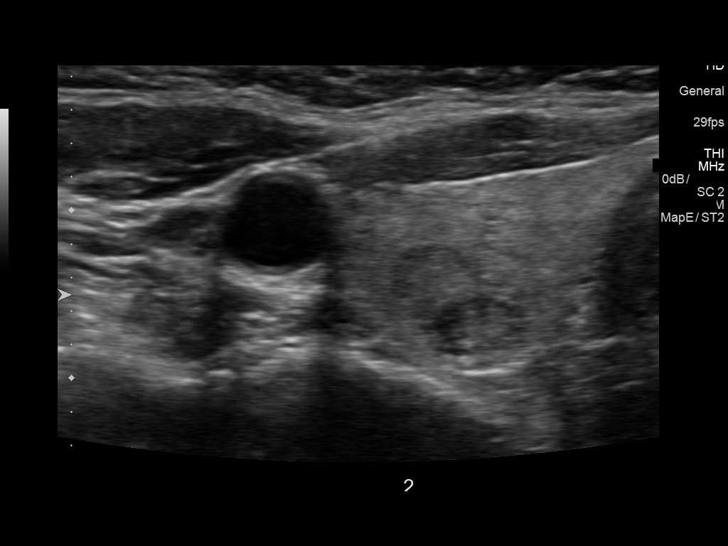
[im 46/53]
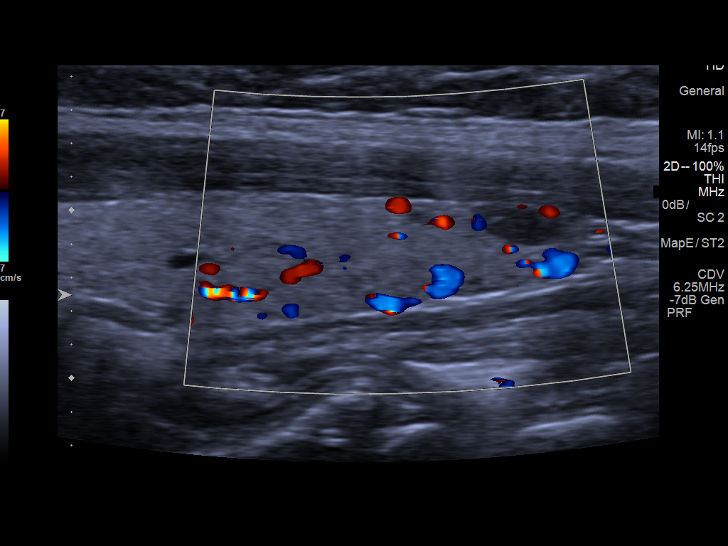
[im 50/53]
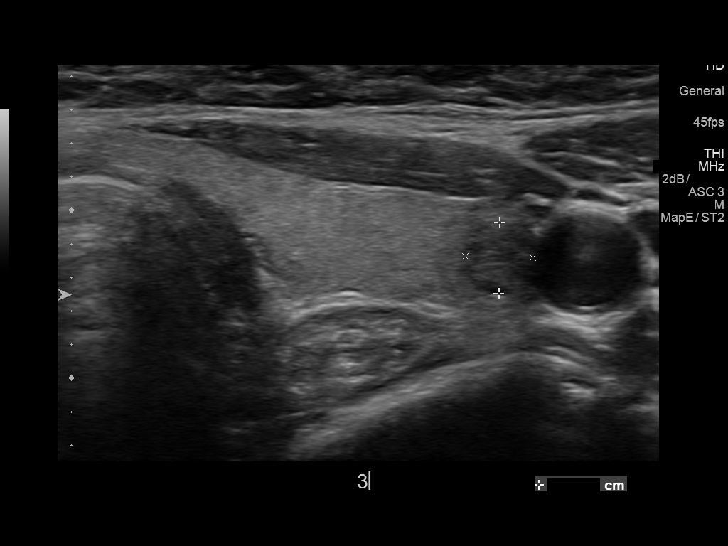

[12 of 25 positions shown; findings below may reference images not displayed]

[DATE]; [DATE]; [DATE];
ultrasound-guided right-sided thyroid nodule fine-needle aspiration
- [DATE]; ultrasound-guided left-sided thyroid nodule
fine-needle aspiration - [DATE]
FINDINGS: Parenchymal Echotexture: Normal

Isthmus: Normal in size measures 0.2 cm in diameter, unchanged

Right lobe: Normal in size measuring 5.7 x 1.0 x 1.6 cm, unchanged,
previously, 6.0 x 1.4 x 2.0 cm

Left lobe: Normal in size measuring 6.0 x 1.1 x 1.7 cm, unchanged,
previously, 5.4 x 1.4 x 1.6 cm

_________________________________________________________

Estimated total number of nodules >/= 1 cm: 0

Number of spongiform nodules >/=  2 cm not described below (TR1): 0

Number of mixed cystic and solid nodules >/= 1.5 cm not described
below (TR2): 0

_________________________________________________________

The previously biopsied/ aspirated cyst within the superior pole of
the right lobe of the thyroid on [DATE] is no longer seen.

There is a punctate (approximately 0.7 x 0.4 x 0.4 cm isoechoic
nodule/pseudo nodule within the mid/posterior aspect of the right
lobe of the thyroid was not definitely seen on the [DATE]
examination though does not meet imaging criteria to recommend
percutaneous sampling or dedicated follow-up.

There is a punctate approximately 0.7 x 0.4 x 0.5 cm nodule within
the adjacent mid/posterior aspect of the right lobe of the thyroid
which appears similar to the [DATE] examination, previously, 0.6 x
0.5 x 0.4 cm, and does not meet imaging criteria to recommend
percutaneous sampling or dedicated follow-up.

The previously biopsied/aspirated nodule/cyst within the left lobe
of the thyroid has continued to decrease in size in the interval,
currently measuring 0.6 x 0.4 x 0.4 cm, previously, 1.8 x 0.8 x
cm. Correlation with prior biopsy results is recommended.

There are several additional punctate (sub 5 mm) anechoic cysts
scattered within the remainder the left lobe of the thyroid, none of
which meet imaging criteria to recommend percutaneous sampling or
dedicated follow-up.
IMPRESSION: 1. Similar findings of multinodular goiter.
2. Interval resolution of previously biopsied/aspirated cyst within
the superior pole of the right lobe of the thyroid.
3. Continued decrease in size of previously biopsied/aspirated
nodule/cyst within the left lobe of the thyroid, currently measuring
0.6 cm, previously, 1.8 cm. Correlation with prior biopsy results is
recommended. Assuming a benign pathologic diagnosis, repeat sampling
and/or continued dedicated follow-up is not recommended.
4. None of the remaining thyroid nodules meet imaging criteria to
recommend percutaneous sampling or dedicated follow-up.
The above is in keeping with the ACR TI-RADS recommendations - [HOSPITAL] [W4];[DATE].

## 2017-02-07 DIAGNOSIS — M797 Fibromyalgia: Secondary | ICD-10-CM | POA: Diagnosis not present

## 2017-02-07 DIAGNOSIS — I10 Essential (primary) hypertension: Secondary | ICD-10-CM | POA: Diagnosis not present

## 2017-02-07 DIAGNOSIS — Z6830 Body mass index (BMI) 30.0-30.9, adult: Secondary | ICD-10-CM | POA: Diagnosis not present

## 2017-04-12 DIAGNOSIS — Z6831 Body mass index (BMI) 31.0-31.9, adult: Secondary | ICD-10-CM | POA: Diagnosis not present

## 2017-04-12 DIAGNOSIS — Z1151 Encounter for screening for human papillomavirus (HPV): Secondary | ICD-10-CM | POA: Diagnosis not present

## 2017-04-12 DIAGNOSIS — Z01419 Encounter for gynecological examination (general) (routine) without abnormal findings: Secondary | ICD-10-CM | POA: Diagnosis not present

## 2017-05-17 ENCOUNTER — Other Ambulatory Visit: Payer: Self-pay | Admitting: Internal Medicine

## 2017-05-17 DIAGNOSIS — E042 Nontoxic multinodular goiter: Secondary | ICD-10-CM

## 2017-05-24 ENCOUNTER — Ambulatory Visit
Admission: RE | Admit: 2017-05-24 | Discharge: 2017-05-24 | Disposition: A | Discharge status: Home / Self Care | Payer: BLUE CROSS/BLUE SHIELD | Source: Ambulatory Visit | Attending: Internal Medicine | Admitting: Internal Medicine

## 2017-05-24 DIAGNOSIS — E042 Nontoxic multinodular goiter: Secondary | ICD-10-CM

## 2017-06-07 DIAGNOSIS — E042 Nontoxic multinodular goiter: Secondary | ICD-10-CM | POA: Diagnosis not present

## 2017-06-07 DIAGNOSIS — E663 Overweight: Secondary | ICD-10-CM | POA: Diagnosis not present

## 2017-08-09 DIAGNOSIS — Z23 Encounter for immunization: Secondary | ICD-10-CM | POA: Diagnosis not present

## 2017-08-09 DIAGNOSIS — I10 Essential (primary) hypertension: Secondary | ICD-10-CM | POA: Diagnosis not present

## 2017-08-09 DIAGNOSIS — M797 Fibromyalgia: Secondary | ICD-10-CM | POA: Diagnosis not present

## 2017-08-09 DIAGNOSIS — G43909 Migraine, unspecified, not intractable, without status migrainosus: Secondary | ICD-10-CM | POA: Diagnosis not present

## 2017-08-09 DIAGNOSIS — H9202 Otalgia, left ear: Secondary | ICD-10-CM | POA: Diagnosis not present

## 2017-08-31 DIAGNOSIS — Z1231 Encounter for screening mammogram for malignant neoplasm of breast: Secondary | ICD-10-CM | POA: Diagnosis not present

## 2017-09-06 DIAGNOSIS — Z961 Presence of intraocular lens: Secondary | ICD-10-CM | POA: Diagnosis not present

## 2017-09-06 DIAGNOSIS — H16223 Keratoconjunctivitis sicca, not specified as Sjogren's, bilateral: Secondary | ICD-10-CM | POA: Diagnosis not present

## 2018-02-07 DIAGNOSIS — I10 Essential (primary) hypertension: Secondary | ICD-10-CM | POA: Diagnosis not present

## 2018-02-07 DIAGNOSIS — R55 Syncope and collapse: Secondary | ICD-10-CM | POA: Diagnosis not present

## 2018-02-07 DIAGNOSIS — M797 Fibromyalgia: Secondary | ICD-10-CM | POA: Diagnosis not present

## 2018-02-27 DIAGNOSIS — R55 Syncope and collapse: Secondary | ICD-10-CM | POA: Diagnosis not present

## 2018-02-27 DIAGNOSIS — I10 Essential (primary) hypertension: Secondary | ICD-10-CM | POA: Diagnosis not present

## 2018-02-27 DIAGNOSIS — E663 Overweight: Secondary | ICD-10-CM | POA: Diagnosis not present

## 2018-03-16 DIAGNOSIS — R55 Syncope and collapse: Secondary | ICD-10-CM | POA: Diagnosis not present

## 2018-04-13 DIAGNOSIS — Z01419 Encounter for gynecological examination (general) (routine) without abnormal findings: Secondary | ICD-10-CM | POA: Diagnosis not present

## 2018-04-13 DIAGNOSIS — Z6831 Body mass index (BMI) 31.0-31.9, adult: Secondary | ICD-10-CM | POA: Diagnosis not present

## 2018-04-13 DIAGNOSIS — Z1151 Encounter for screening for human papillomavirus (HPV): Secondary | ICD-10-CM | POA: Diagnosis not present

## 2018-04-18 DIAGNOSIS — I10 Essential (primary) hypertension: Secondary | ICD-10-CM | POA: Diagnosis not present

## 2018-04-18 DIAGNOSIS — R55 Syncope and collapse: Secondary | ICD-10-CM | POA: Diagnosis not present

## 2018-08-14 DIAGNOSIS — R05 Cough: Secondary | ICD-10-CM | POA: Diagnosis not present

## 2018-08-14 DIAGNOSIS — I10 Essential (primary) hypertension: Secondary | ICD-10-CM | POA: Diagnosis not present

## 2018-08-14 DIAGNOSIS — Z23 Encounter for immunization: Secondary | ICD-10-CM | POA: Diagnosis not present

## 2018-08-14 DIAGNOSIS — M797 Fibromyalgia: Secondary | ICD-10-CM | POA: Diagnosis not present

## 2018-09-03 DIAGNOSIS — Z1231 Encounter for screening mammogram for malignant neoplasm of breast: Secondary | ICD-10-CM | POA: Diagnosis not present

## 2018-09-19 DIAGNOSIS — I10 Essential (primary) hypertension: Secondary | ICD-10-CM | POA: Diagnosis not present

## 2018-09-19 DIAGNOSIS — H16223 Keratoconjunctivitis sicca, not specified as Sjogren's, bilateral: Secondary | ICD-10-CM | POA: Diagnosis not present

## 2018-09-19 DIAGNOSIS — Z961 Presence of intraocular lens: Secondary | ICD-10-CM | POA: Diagnosis not present

## 2018-11-14 DIAGNOSIS — I10 Essential (primary) hypertension: Secondary | ICD-10-CM | POA: Diagnosis not present

## 2018-11-14 DIAGNOSIS — M797 Fibromyalgia: Secondary | ICD-10-CM | POA: Diagnosis not present

## 2018-11-14 DIAGNOSIS — E669 Obesity, unspecified: Secondary | ICD-10-CM | POA: Diagnosis not present

## 2018-12-20 DIAGNOSIS — G43009 Migraine without aura, not intractable, without status migrainosus: Secondary | ICD-10-CM | POA: Diagnosis not present

## 2018-12-20 DIAGNOSIS — I10 Essential (primary) hypertension: Secondary | ICD-10-CM | POA: Diagnosis not present

## 2018-12-20 DIAGNOSIS — M797 Fibromyalgia: Secondary | ICD-10-CM | POA: Diagnosis not present

## 2018-12-20 DIAGNOSIS — M25552 Pain in left hip: Secondary | ICD-10-CM | POA: Diagnosis not present

## 2019-01-08 DIAGNOSIS — M5441 Lumbago with sciatica, right side: Secondary | ICD-10-CM | POA: Diagnosis not present

## 2019-01-08 DIAGNOSIS — M545 Low back pain: Secondary | ICD-10-CM | POA: Diagnosis not present

## 2019-01-08 DIAGNOSIS — M25551 Pain in right hip: Secondary | ICD-10-CM | POA: Diagnosis not present

## 2019-02-27 DIAGNOSIS — M797 Fibromyalgia: Secondary | ICD-10-CM | POA: Diagnosis not present

## 2019-02-27 DIAGNOSIS — E669 Obesity, unspecified: Secondary | ICD-10-CM | POA: Diagnosis not present

## 2019-02-27 DIAGNOSIS — I10 Essential (primary) hypertension: Secondary | ICD-10-CM | POA: Diagnosis not present

## 2019-05-02 DIAGNOSIS — M797 Fibromyalgia: Secondary | ICD-10-CM | POA: Diagnosis not present

## 2019-05-02 DIAGNOSIS — M5431 Sciatica, right side: Secondary | ICD-10-CM | POA: Diagnosis not present

## 2019-08-29 DIAGNOSIS — I10 Essential (primary) hypertension: Secondary | ICD-10-CM | POA: Diagnosis not present

## 2019-08-29 DIAGNOSIS — M5431 Sciatica, right side: Secondary | ICD-10-CM | POA: Diagnosis not present

## 2019-08-29 DIAGNOSIS — Z23 Encounter for immunization: Secondary | ICD-10-CM | POA: Diagnosis not present

## 2019-08-29 DIAGNOSIS — Z1159 Encounter for screening for other viral diseases: Secondary | ICD-10-CM | POA: Diagnosis not present

## 2019-08-29 DIAGNOSIS — M797 Fibromyalgia: Secondary | ICD-10-CM | POA: Diagnosis not present

## 2019-08-30 ENCOUNTER — Other Ambulatory Visit: Payer: Self-pay | Admitting: Family Medicine

## 2019-08-30 DIAGNOSIS — M5431 Sciatica, right side: Secondary | ICD-10-CM

## 2019-09-05 DIAGNOSIS — Z1231 Encounter for screening mammogram for malignant neoplasm of breast: Secondary | ICD-10-CM | POA: Diagnosis not present

## 2019-09-11 ENCOUNTER — Ambulatory Visit
Admission: RE | Admit: 2019-09-11 | Discharge: 2019-09-11 | Disposition: A | Discharge status: Home / Self Care | Payer: BC Managed Care – PPO | Source: Ambulatory Visit | Attending: Family Medicine | Admitting: Family Medicine

## 2019-09-11 ENCOUNTER — Other Ambulatory Visit: Payer: Self-pay

## 2019-09-11 DIAGNOSIS — M48061 Spinal stenosis, lumbar region without neurogenic claudication: Secondary | ICD-10-CM | POA: Diagnosis not present

## 2019-09-11 DIAGNOSIS — M5431 Sciatica, right side: Secondary | ICD-10-CM

## 2019-09-11 IMAGING — MR MR LUMBAR SPINE W/O CM
4 of 5 series · 27 of 48 positions shown · non-contrast
Comparison: None.

CLINICAL DATA: Right hip and leg pain since [DATE]

EXAM:
MRI LUMBAR SPINE WITHOUT CONTRAST
TECHNIQUE: Multiplanar, multisequence MR imaging of the lumbar spine was
performed. No intravenous contrast was administered.

[Series 2: T2 · sagittal · 4.0mm · 1.09mm/px · 6 of 16 slices shown (1 of 2)]
[im 1/16]
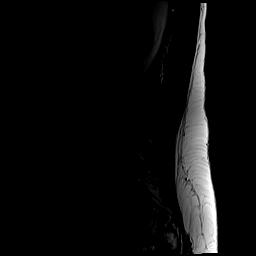
[im 4/16]
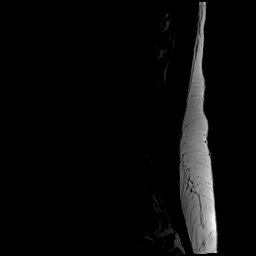
[im 7/16]
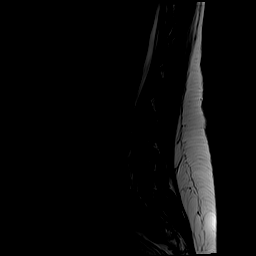
[im 10/16]
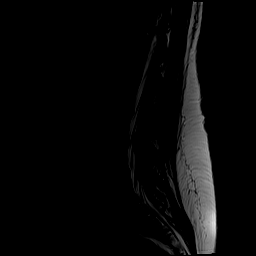
[im 13/16]
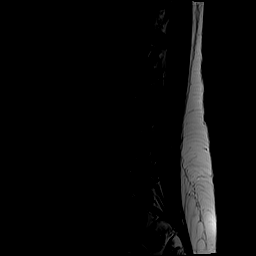
[im 16/16]
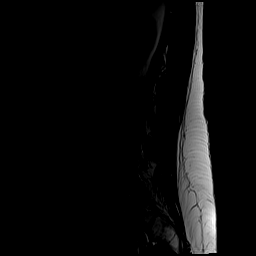

[Series 4: T1 · sagittal · 4.0mm · 1.09mm/px · 6 of 16 slices shown (1 of 2)]
[im 1/16]
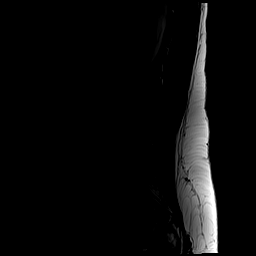
[im 4/16]
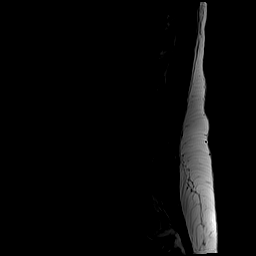
[im 7/16]
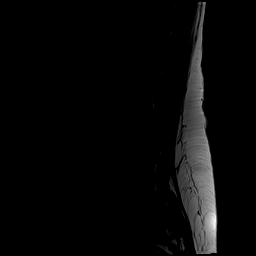
[im 10/16]
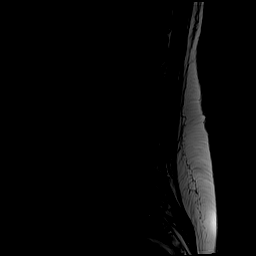
[im 13/16]
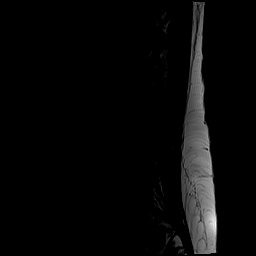
[im 16/16]
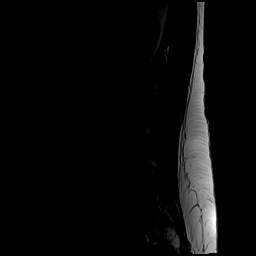

[Series 5: T2 · axial · 4.0mm · 0.39mm/px · z∈[-121,+96]mm · 9 of 41 slices shown (2 of 2)]
[im 1/41]
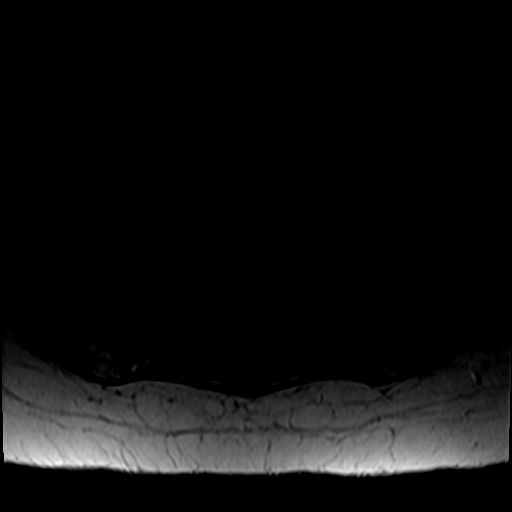
[im 6/41]
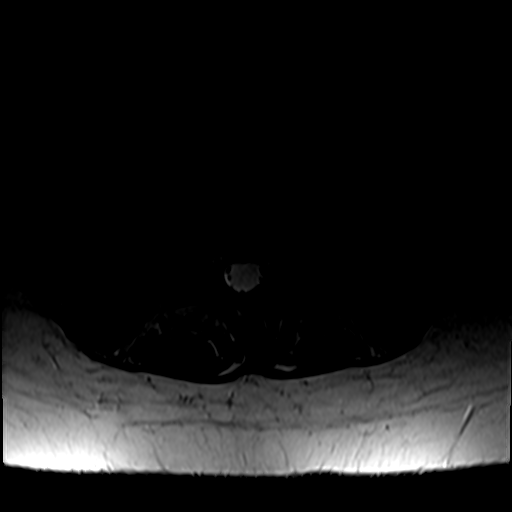
[im 12/41]
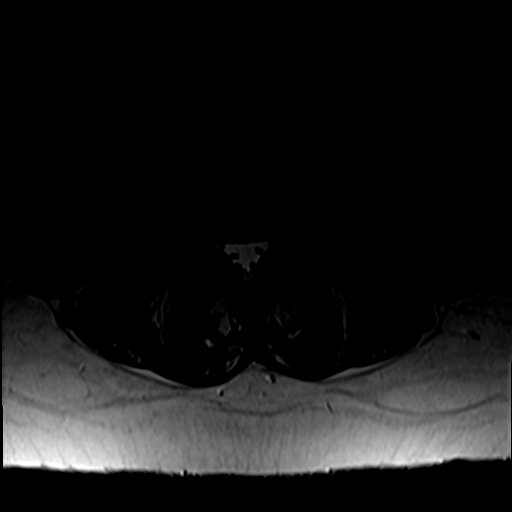
[im 18/41]
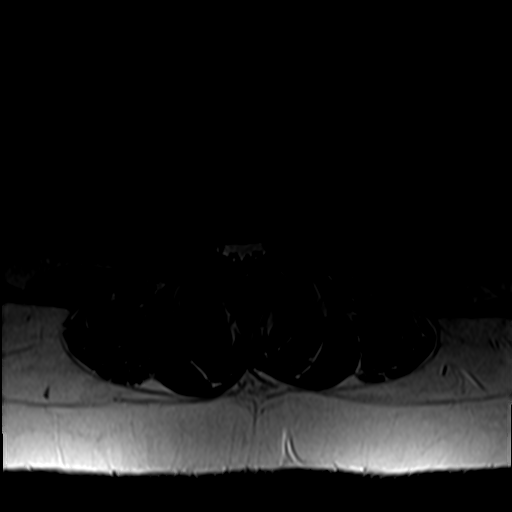
[im 21/41]
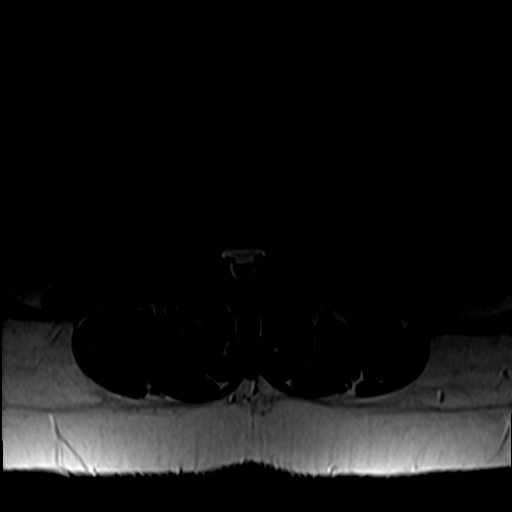
[im 23/41]
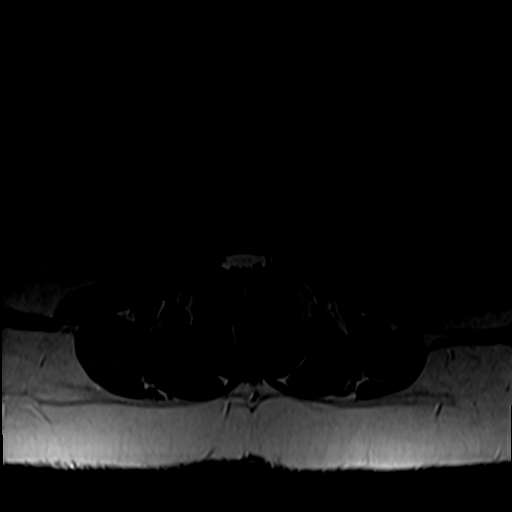
[im 29/41]
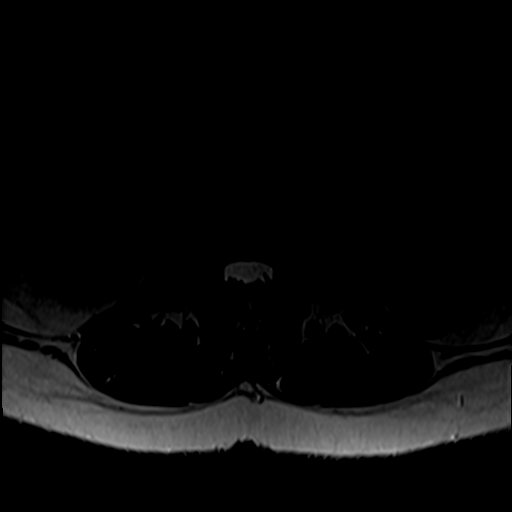
[im 35/41]
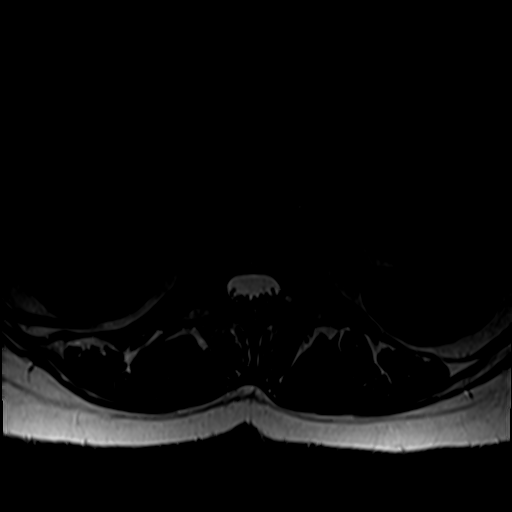
[im 41/41]
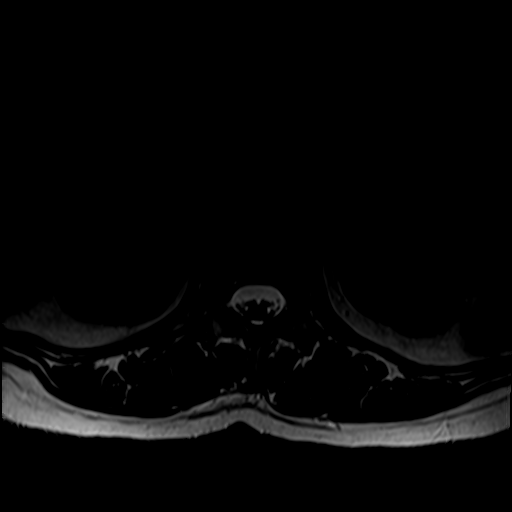

[Series 6: T1 · axial · 4.0mm · 0.39mm/px · z∈[-121,+67]mm · 6 of 41 slices shown (2 of 2)]
[im 1/41]
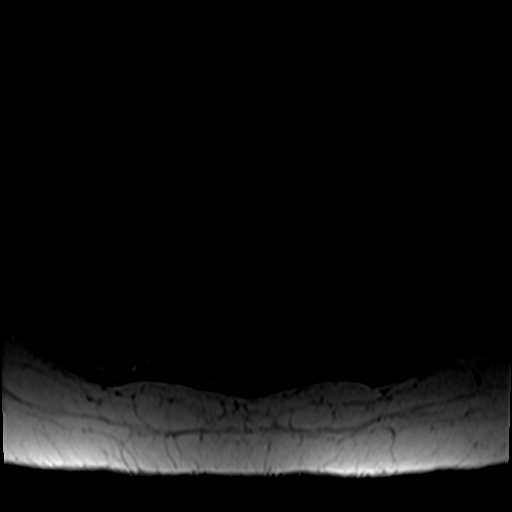
[im 6/41]
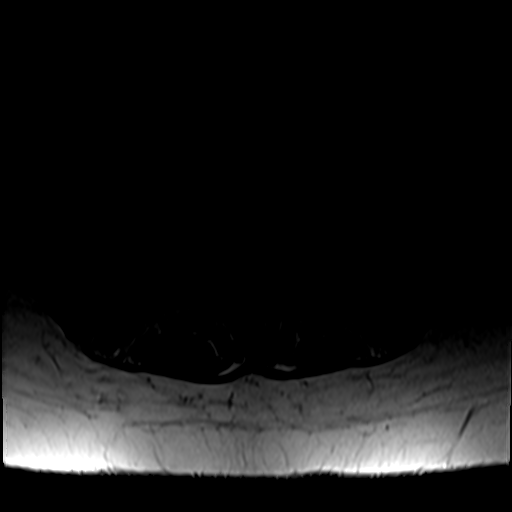
[im 12/41]
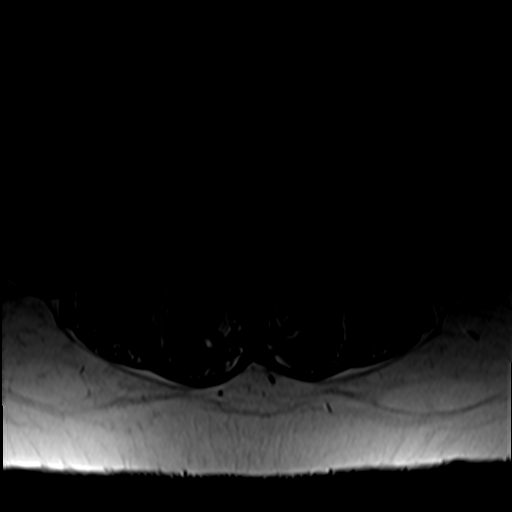
[im 18/41]
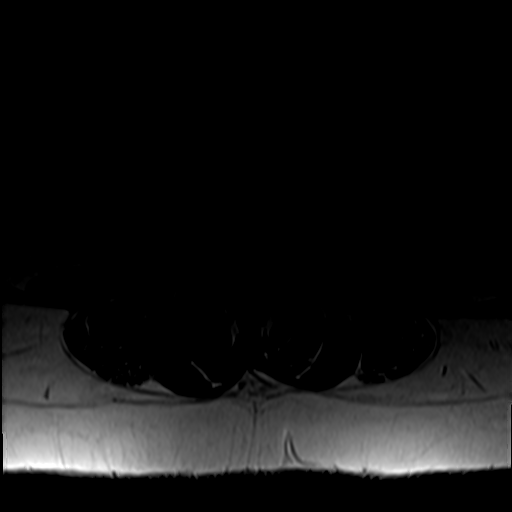
[im 21/41]
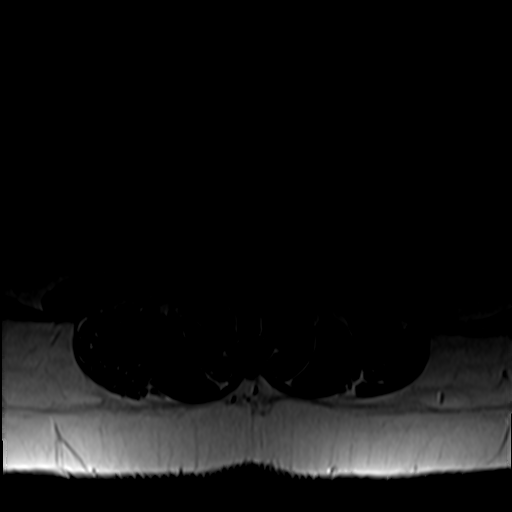
[im 35/41]
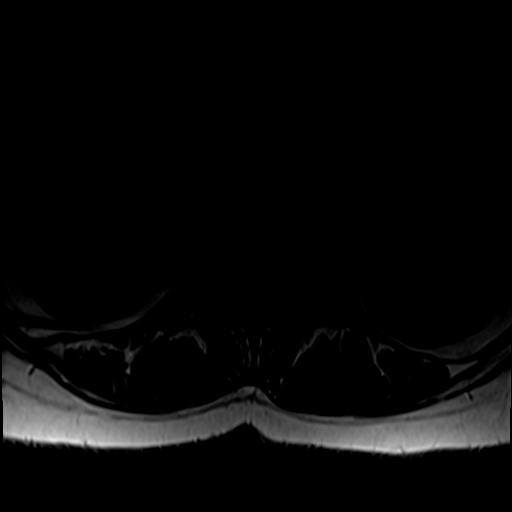

[27 of 48 positions shown; findings below may reference images not displayed]

FINDINGS: Segmentation: The lowest lumbar type non-rib-bearing vertebra is
labeled as L5.

Alignment:  3 mm degenerative anterolisthesis at L3-4.

Vertebrae: 1.7 cm in long axis hemangioma centrally in the L3
vertebral body. Mild degenerative facet edema bilaterally at L3-4
and L4-5. 1.1 cm hemangioma in the L1 vertebral body eccentric to
the left.

Conus medullaris and cauda equina: Conus extends to the L1 level.
Conus and cauda equina appear normal.

Paraspinal and other soft tissues: A 1.8 cm filling defect in the
gallbladder is most compatible with gallstone.

Disc levels:

T12-L1: Unremarkable.

L1-2: Unremarkable.

L2-3: Borderline left foraminal stenosis due to degenerative facet
arthropathy.

L3-4: Mild to moderate left and mild right foraminal stenosis with
mild bilateral subarticular lateral recess stenosis and borderline
central narrowing of the thecal sac due to disc bulge, facet
arthropathy, and intervertebral spurring. Mild displacement of both
L3 nerves in the lateral extraforaminal space due to disc bulge.

L4-5: Mild right foraminal stenosis and borderline bilateral
subarticular lateral recess stenosis due to disc bulge and facet
arthropathy.

L5-S1: Unremarkable
IMPRESSION: 1. Lumbar spondylosis and degenerative disc disease, causing mild to
moderate impingement at L3-4 and mild impingement at L4-5, as
detailed above.
2. Cholelithiasis.

## 2019-09-17 DIAGNOSIS — M533 Sacrococcygeal disorders, not elsewhere classified: Secondary | ICD-10-CM | POA: Diagnosis not present

## 2019-10-15 DIAGNOSIS — M533 Sacrococcygeal disorders, not elsewhere classified: Secondary | ICD-10-CM | POA: Diagnosis not present

## 2019-11-11 DIAGNOSIS — M5416 Radiculopathy, lumbar region: Secondary | ICD-10-CM | POA: Diagnosis not present

## 2019-12-04 DIAGNOSIS — M79604 Pain in right leg: Secondary | ICD-10-CM | POA: Diagnosis not present

## 2020-02-17 DIAGNOSIS — H16223 Keratoconjunctivitis sicca, not specified as Sjogren's, bilateral: Secondary | ICD-10-CM | POA: Diagnosis not present

## 2020-02-17 DIAGNOSIS — H43393 Other vitreous opacities, bilateral: Secondary | ICD-10-CM | POA: Diagnosis not present

## 2020-02-17 DIAGNOSIS — I10 Essential (primary) hypertension: Secondary | ICD-10-CM | POA: Diagnosis not present

## 2020-02-17 DIAGNOSIS — Z961 Presence of intraocular lens: Secondary | ICD-10-CM | POA: Diagnosis not present

## 2020-02-27 DIAGNOSIS — I10 Essential (primary) hypertension: Secondary | ICD-10-CM | POA: Diagnosis not present

## 2020-02-27 DIAGNOSIS — M79604 Pain in right leg: Secondary | ICD-10-CM | POA: Diagnosis not present

## 2020-02-27 DIAGNOSIS — M797 Fibromyalgia: Secondary | ICD-10-CM | POA: Diagnosis not present

## 2020-08-18 DIAGNOSIS — Z01419 Encounter for gynecological examination (general) (routine) without abnormal findings: Secondary | ICD-10-CM | POA: Diagnosis not present

## 2020-08-18 DIAGNOSIS — I10 Essential (primary) hypertension: Secondary | ICD-10-CM | POA: Diagnosis not present

## 2020-08-18 DIAGNOSIS — Z780 Asymptomatic menopausal state: Secondary | ICD-10-CM | POA: Diagnosis not present

## 2020-08-18 DIAGNOSIS — Z124 Encounter for screening for malignant neoplasm of cervix: Secondary | ICD-10-CM | POA: Diagnosis not present

## 2020-08-26 DIAGNOSIS — I10 Essential (primary) hypertension: Secondary | ICD-10-CM | POA: Diagnosis not present

## 2020-08-26 DIAGNOSIS — M25512 Pain in left shoulder: Secondary | ICD-10-CM | POA: Diagnosis not present

## 2020-08-26 DIAGNOSIS — G43909 Migraine, unspecified, not intractable, without status migrainosus: Secondary | ICD-10-CM | POA: Diagnosis not present

## 2020-08-26 DIAGNOSIS — Z23 Encounter for immunization: Secondary | ICD-10-CM | POA: Diagnosis not present

## 2020-08-26 DIAGNOSIS — M797 Fibromyalgia: Secondary | ICD-10-CM | POA: Diagnosis not present

## 2020-09-08 DIAGNOSIS — Z780 Asymptomatic menopausal state: Secondary | ICD-10-CM | POA: Diagnosis not present

## 2020-09-08 DIAGNOSIS — M85852 Other specified disorders of bone density and structure, left thigh: Secondary | ICD-10-CM | POA: Diagnosis not present

## 2020-09-08 DIAGNOSIS — M85851 Other specified disorders of bone density and structure, right thigh: Secondary | ICD-10-CM | POA: Diagnosis not present

## 2020-09-08 DIAGNOSIS — Z1231 Encounter for screening mammogram for malignant neoplasm of breast: Secondary | ICD-10-CM | POA: Diagnosis not present

## 2020-09-29 DIAGNOSIS — M67912 Unspecified disorder of synovium and tendon, left shoulder: Secondary | ICD-10-CM | POA: Diagnosis not present

## 2020-10-31 HISTORY — PX: SHOULDER ARTHROSCOPY: SHX128

## 2020-12-15 DIAGNOSIS — M67912 Unspecified disorder of synovium and tendon, left shoulder: Secondary | ICD-10-CM | POA: Diagnosis not present

## 2020-12-15 DIAGNOSIS — M25512 Pain in left shoulder: Secondary | ICD-10-CM | POA: Diagnosis not present

## 2021-01-26 DIAGNOSIS — M25512 Pain in left shoulder: Secondary | ICD-10-CM | POA: Diagnosis not present

## 2021-01-26 DIAGNOSIS — M67912 Unspecified disorder of synovium and tendon, left shoulder: Secondary | ICD-10-CM | POA: Diagnosis not present

## 2021-02-24 DIAGNOSIS — I10 Essential (primary) hypertension: Secondary | ICD-10-CM | POA: Diagnosis not present

## 2021-02-24 DIAGNOSIS — M797 Fibromyalgia: Secondary | ICD-10-CM | POA: Diagnosis not present

## 2021-03-17 DIAGNOSIS — H40013 Open angle with borderline findings, low risk, bilateral: Secondary | ICD-10-CM | POA: Diagnosis not present

## 2021-03-17 DIAGNOSIS — H16223 Keratoconjunctivitis sicca, not specified as Sjogren's, bilateral: Secondary | ICD-10-CM | POA: Diagnosis not present

## 2021-03-17 DIAGNOSIS — Z961 Presence of intraocular lens: Secondary | ICD-10-CM | POA: Diagnosis not present

## 2021-03-17 DIAGNOSIS — H43393 Other vitreous opacities, bilateral: Secondary | ICD-10-CM | POA: Diagnosis not present

## 2021-04-29 DIAGNOSIS — M25511 Pain in right shoulder: Secondary | ICD-10-CM | POA: Diagnosis not present

## 2021-04-29 DIAGNOSIS — M67911 Unspecified disorder of synovium and tendon, right shoulder: Secondary | ICD-10-CM | POA: Diagnosis not present

## 2021-04-29 DIAGNOSIS — M25512 Pain in left shoulder: Secondary | ICD-10-CM | POA: Diagnosis not present

## 2021-04-29 DIAGNOSIS — M67912 Unspecified disorder of synovium and tendon, left shoulder: Secondary | ICD-10-CM | POA: Diagnosis not present

## 2021-05-05 ENCOUNTER — Other Ambulatory Visit: Payer: Self-pay | Admitting: Orthopedic Surgery

## 2021-05-05 DIAGNOSIS — M67911 Unspecified disorder of synovium and tendon, right shoulder: Secondary | ICD-10-CM

## 2021-05-20 ENCOUNTER — Ambulatory Visit
Admission: RE | Admit: 2021-05-20 | Discharge: 2021-05-20 | Disposition: A | Discharge status: Home / Self Care | Payer: BC Managed Care – PPO | Source: Ambulatory Visit | Attending: Orthopedic Surgery | Admitting: Orthopedic Surgery

## 2021-05-20 ENCOUNTER — Other Ambulatory Visit: Payer: Self-pay

## 2021-05-20 DIAGNOSIS — M67911 Unspecified disorder of synovium and tendon, right shoulder: Secondary | ICD-10-CM

## 2021-05-20 DIAGNOSIS — M67912 Unspecified disorder of synovium and tendon, left shoulder: Secondary | ICD-10-CM

## 2021-05-20 DIAGNOSIS — M79622 Pain in left upper arm: Secondary | ICD-10-CM | POA: Diagnosis not present

## 2021-05-20 DIAGNOSIS — M19012 Primary osteoarthritis, left shoulder: Secondary | ICD-10-CM | POA: Diagnosis not present

## 2021-05-20 DIAGNOSIS — M19011 Primary osteoarthritis, right shoulder: Secondary | ICD-10-CM | POA: Diagnosis not present

## 2021-05-20 DIAGNOSIS — M75101 Unspecified rotator cuff tear or rupture of right shoulder, not specified as traumatic: Secondary | ICD-10-CM | POA: Diagnosis not present

## 2021-05-20 DIAGNOSIS — M79621 Pain in right upper arm: Secondary | ICD-10-CM | POA: Diagnosis not present

## 2021-05-20 DIAGNOSIS — M25412 Effusion, left shoulder: Secondary | ICD-10-CM | POA: Diagnosis not present

## 2021-05-20 IMAGING — MR MR SHOULDER*R* W/O CM
4 of 5 series · 21 of 40 positions shown · non-contrast
Comparison: None.

CLINICAL DATA: Chronic Bilateral shoulder pain and upper arm pain
since [OR].

EXAM:
MRI OF THE RIGHT SHOULDER WITHOUT CONTRAST
TECHNIQUE: Multiplanar, multisequence MR imaging of the shoulder was performed.
No intravenous contrast was administered.

[Series 6: T2 fat-sat · axial · right · 3.0mm · 0.47mm/px · z∈[-72,+28]mm · 8 of 27 slices shown (1 of 3)]
[im 1/27]
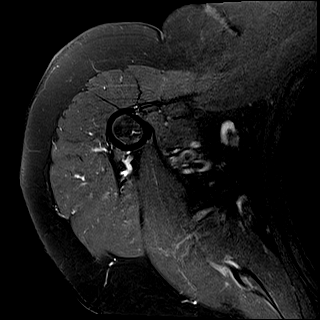
[im 3/27]
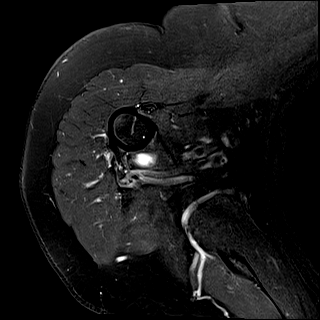
[im 9/27]
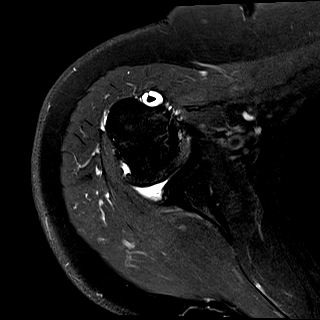
[im 12/27]
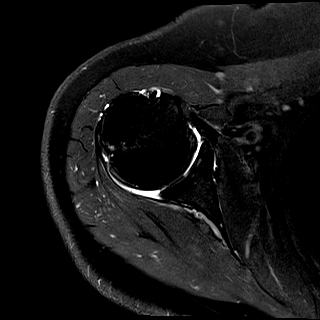
[im 15/27]
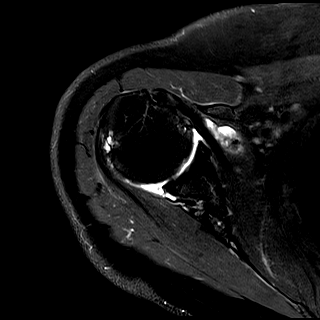
[im 18/27]
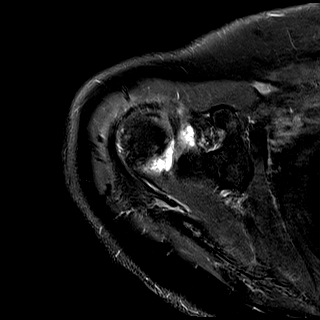
[im 24/27]
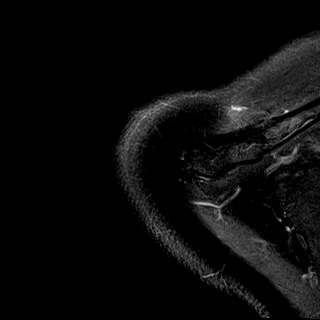
[im 27/27]
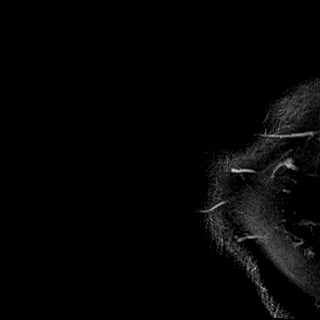

[Series 7: T2 fat-sat · oblique · right · 4.0mm · 0.22mm/px · 3 of 21 slices shown (2 of 3)]
[im 4/21]
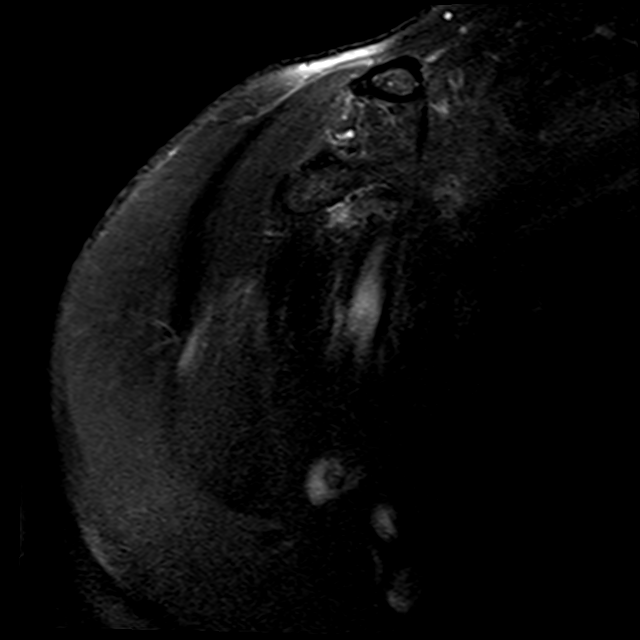
[im 11/21]
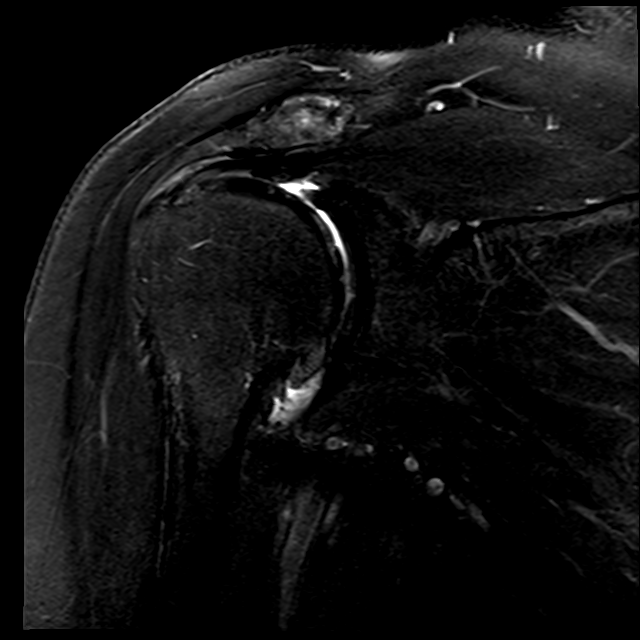
[im 17/21]
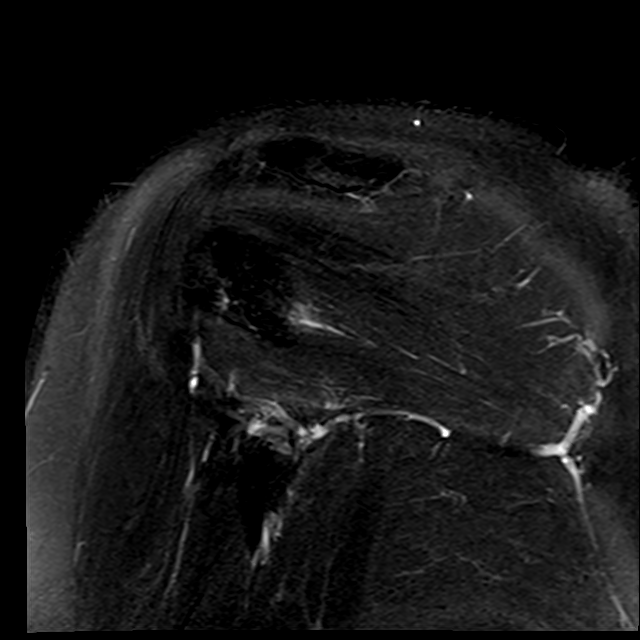

[Series 8: PD · oblique · right · 4.0mm · 0.22mm/px · 7 of 21 slices shown]
[im 1/21]
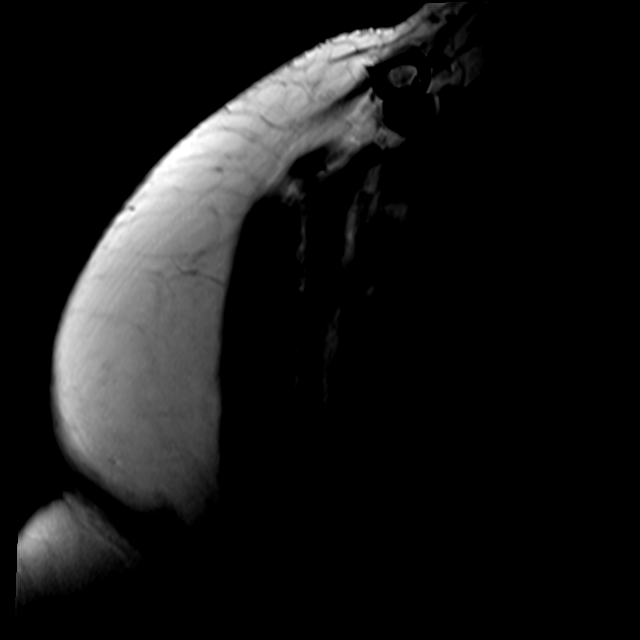
[im 4/21]
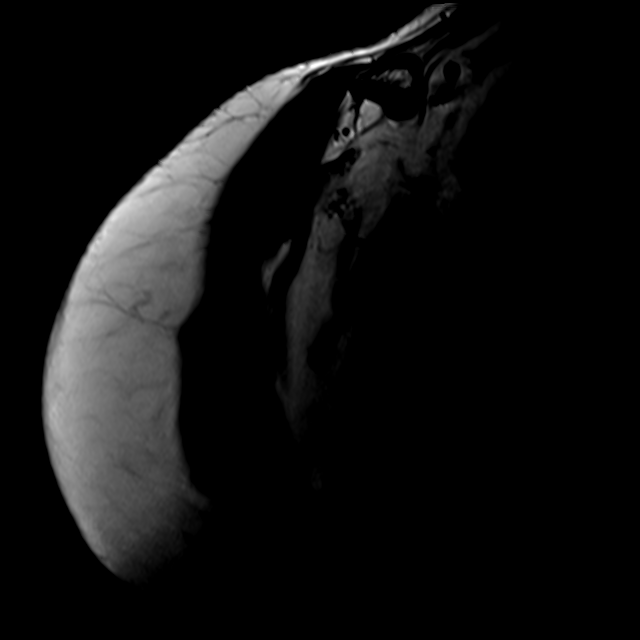
[im 7/21]
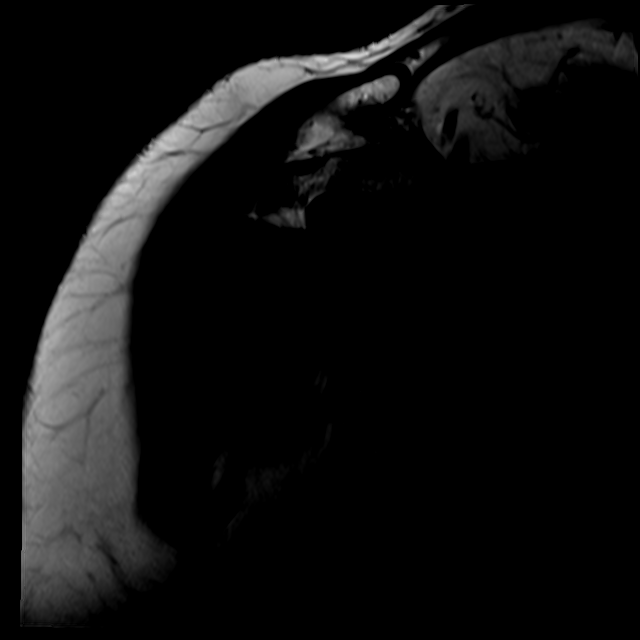
[im 11/21]
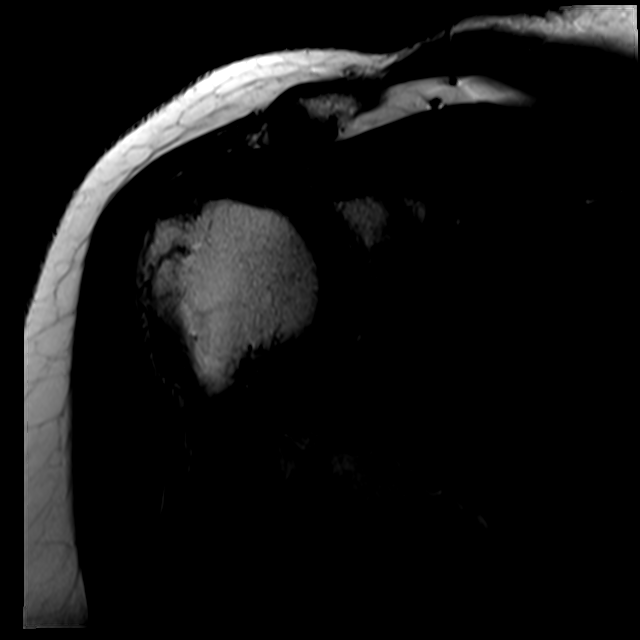
[im 14/21]
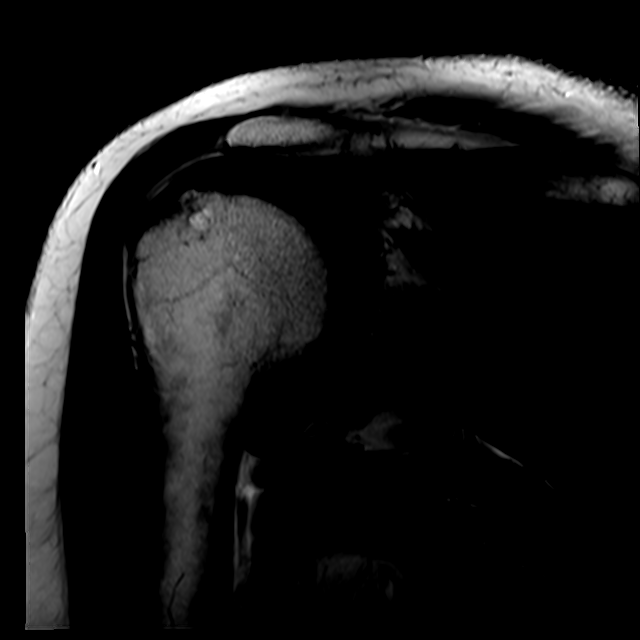
[im 17/21]
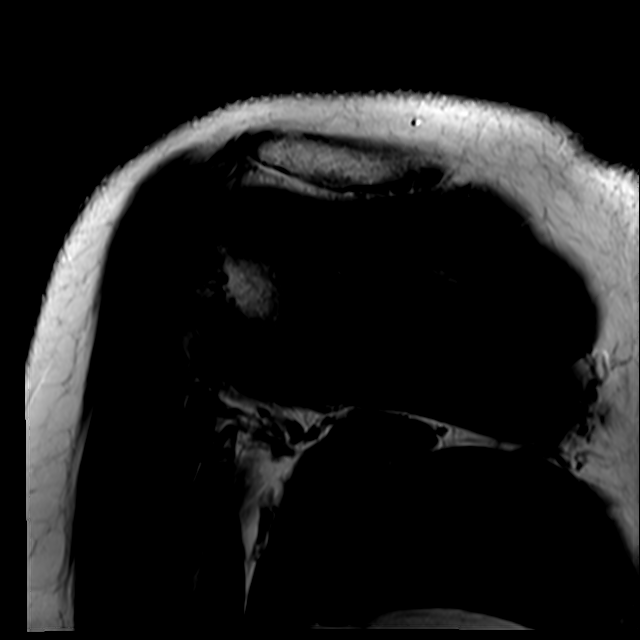
[im 21/21]
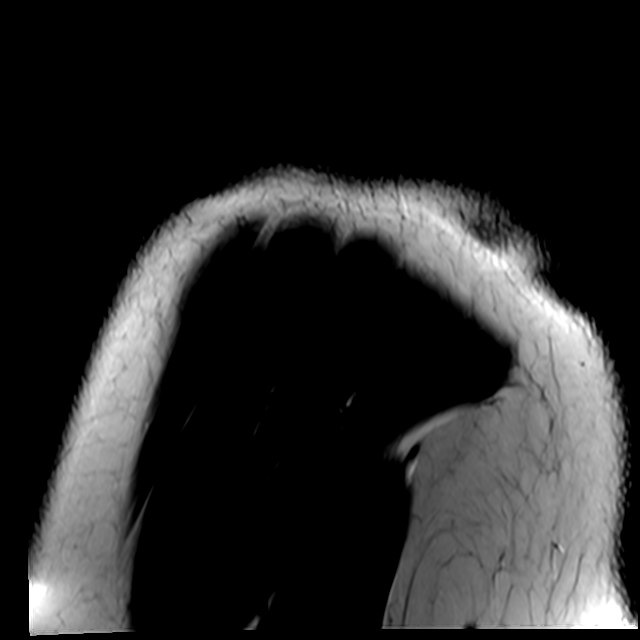

[Series 9: T2 fat-sat · coronal · right · 4.0mm · 0.44mm/px · 3 of 23 slices shown (3 of 3)]
[im 4/23]
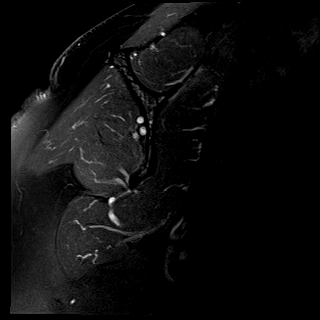
[im 13/23]
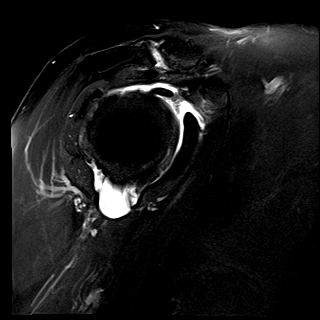
[im 19/23]
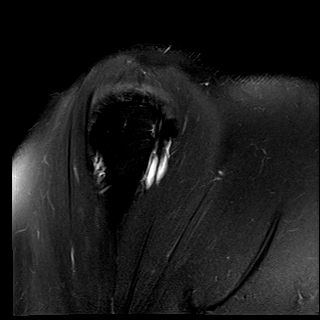

[21 of 40 positions shown; findings below may reference images not displayed]

FINDINGS: Rotator cuff: Mild tendinosis of the supraspinatus tendon with a
tiny insertional interstitial tear. Mild tendinosis of the
infraspinatus tendon. Teres minor tendon is intact. Subscapularis
tendon is intact.

Muscles: No muscle atrophy or edema. No intramuscular fluid
collection or hematoma.

Biceps Long Head: Mild tendinosis of the intra-articular portion of
the long head of the biceps tendon.

Acromioclavicular Joint: Moderate arthropathy of the
acromioclavicular joint. No subacromial/subdeltoid bursal fluid.

Glenohumeral Joint: Small joint effusion. High-grade
partial-thickness cartilage loss of the glenohumeral joint. 12 mm
loose body in the subcoracoid recess.

Labrum: Grossly intact, but evaluation is limited by lack of
intraarticular fluid/contrast.

Bones: No fracture or dislocation. No aggressive osseous lesion.
Subcortical reactive marrow changes at the infraspinatus insertion.

Other: No fluid collection or hematoma.
IMPRESSION: 1. Mild tendinosis of the supraspinatus tendon with a tiny
insertional interstitial tear.
2. Mild tendinosis of the infraspinatus tendon.
3. Mild tendinosis of the intra-articular portion of the long head
of the biceps tendon.
4. Moderate osteoarthritis of the right glenohumeral joint. 12 mm
loose body in the subcoracoid recess.

## 2021-05-20 IMAGING — MR MR SHOULDER*L* W/O CM
4 of 5 series · 21 of 40 positions shown · non-contrast
Comparison: None.

CLINICAL DATA: Chronic Bilateral shoulder pain and upper arm pain
since [83].

EXAM:
MRI OF THE LEFT SHOULDER WITHOUT CONTRAST
TECHNIQUE: Multiplanar, multisequence MR imaging of the shoulder was performed.
No intravenous contrast was administered.

[Series 6: T2 fat-sat · axial · left · 3.0mm · 0.47mm/px · z∈[-61,+38]mm · 8 of 27 slices shown (1 of 3)]
[im 1/27]
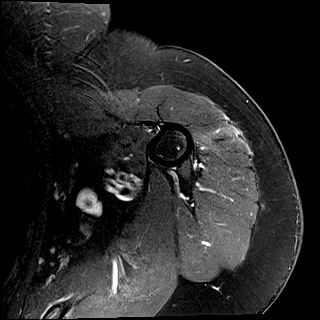
[im 3/27]
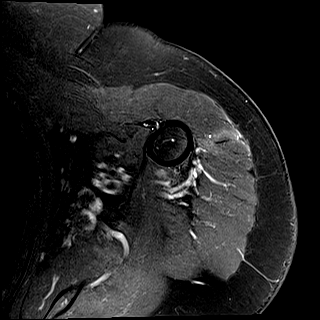
[im 9/27]
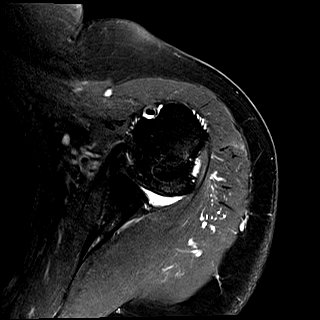
[im 12/27]
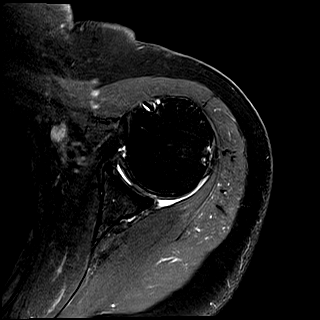
[im 15/27]
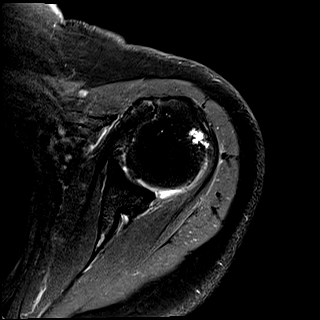
[im 18/27]
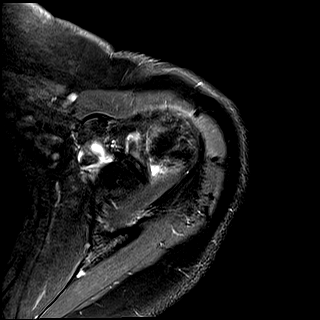
[im 24/27]
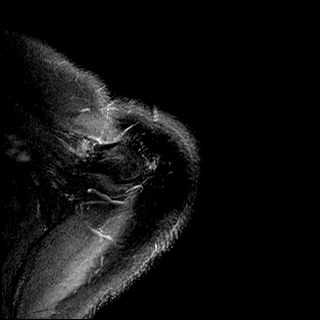
[im 27/27]
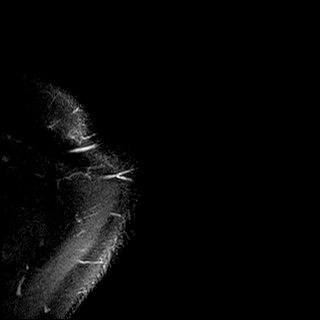

[Series 9: T2 fat-sat · coronal · left · 4.0mm · 0.44mm/px · 3 of 23 slices shown (2 of 3)]
[im 4/23]
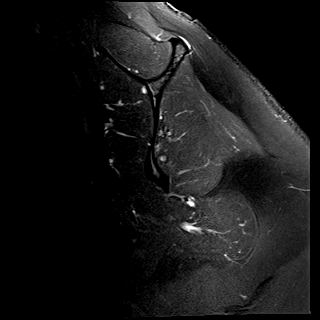
[im 13/23]
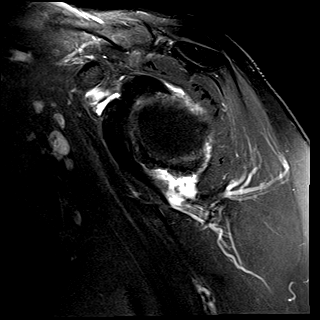
[im 19/23]
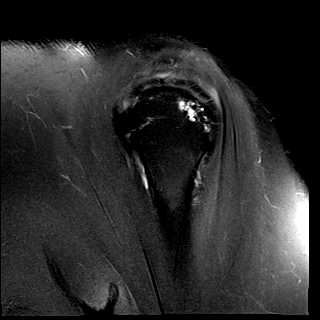

[Series 101: PD · oblique · left · 4.0mm · 0.22mm/px · 7 of 21 slices shown]
[im 1/21]
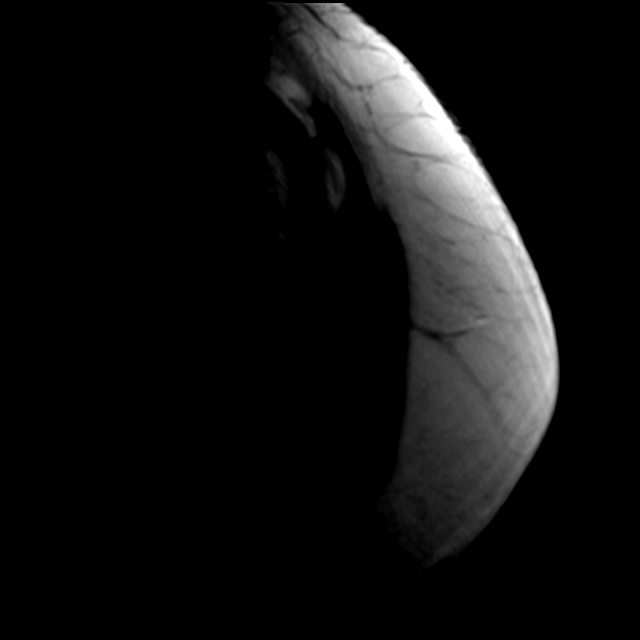
[im 4/21]
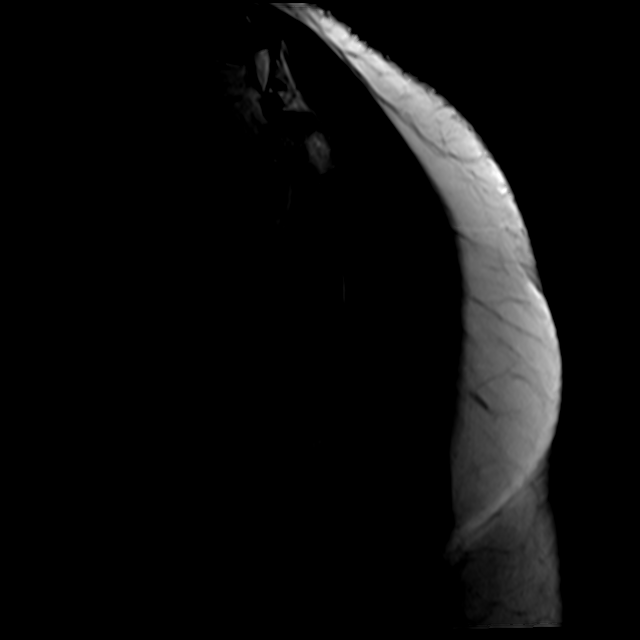
[im 7/21]
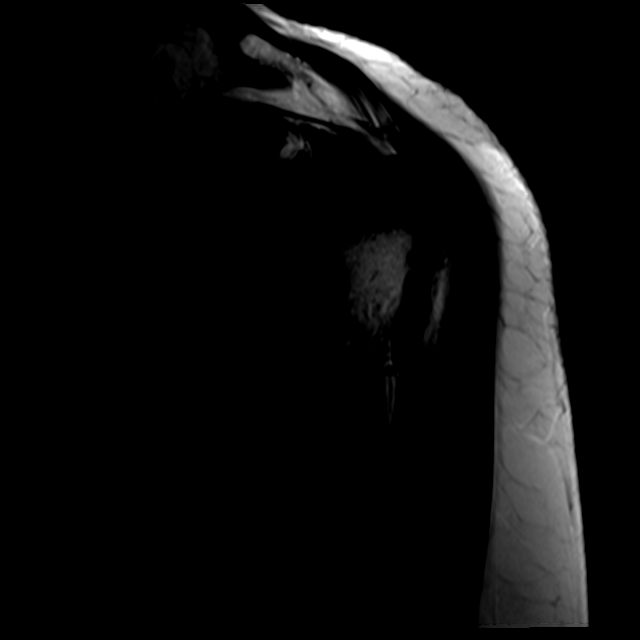
[im 11/21]
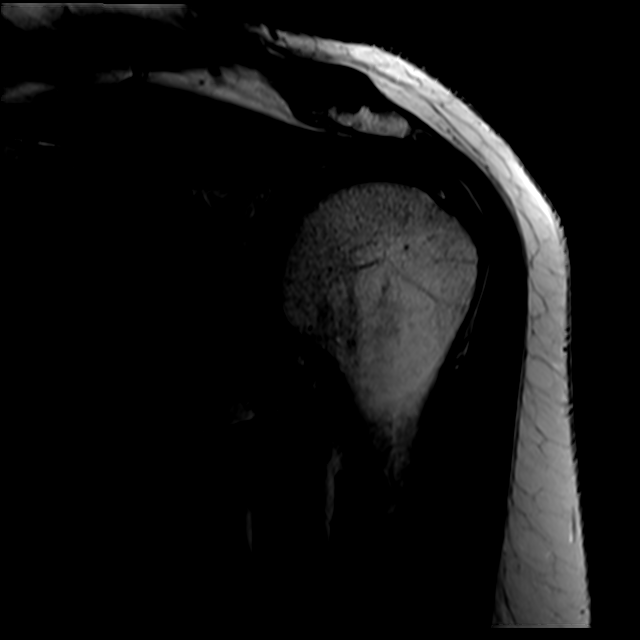
[im 14/21]
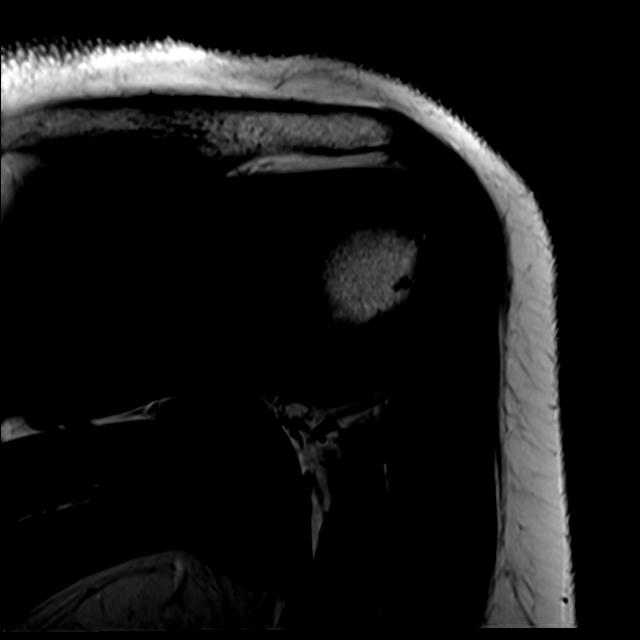
[im 17/21]
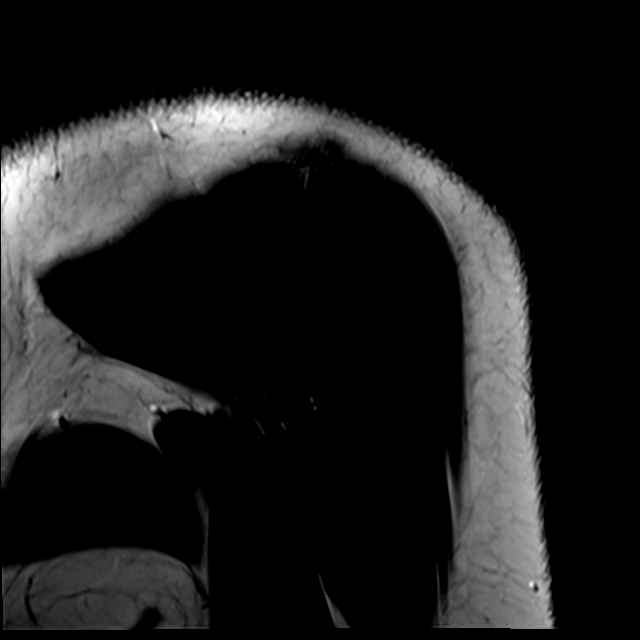
[im 21/21]
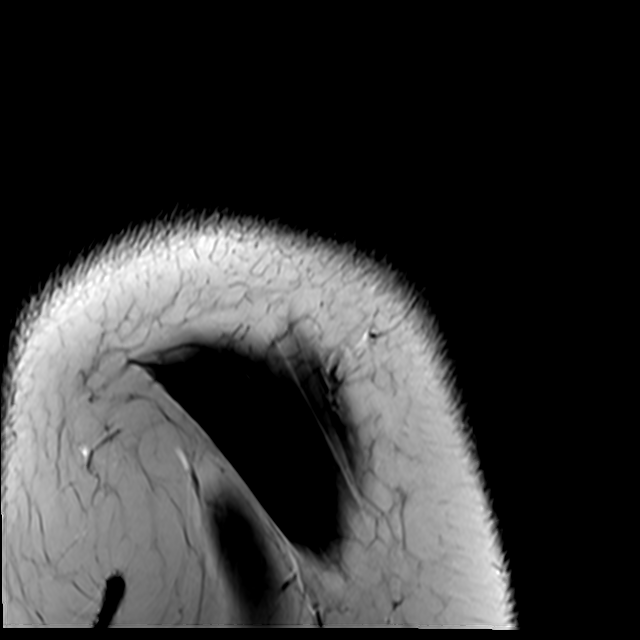

[Series 102: T2 fat-sat · oblique · left · 4.0mm · 0.22mm/px · 3 of 21 slices shown (3 of 3)]
[im 4/21]
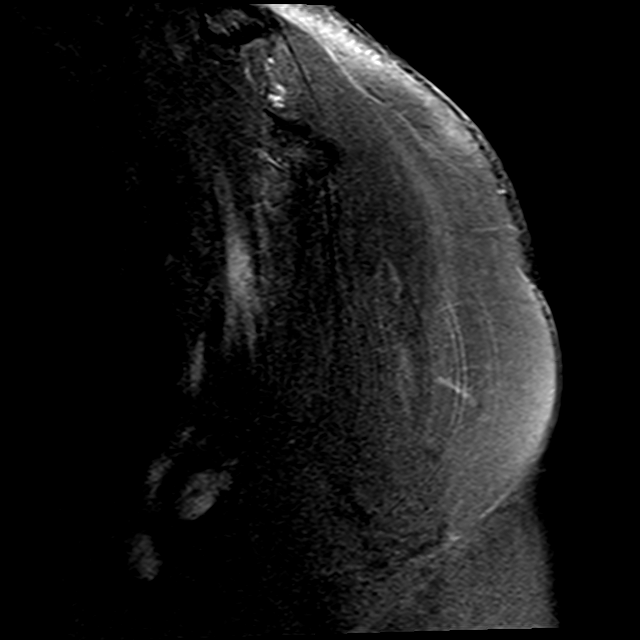
[im 11/21]
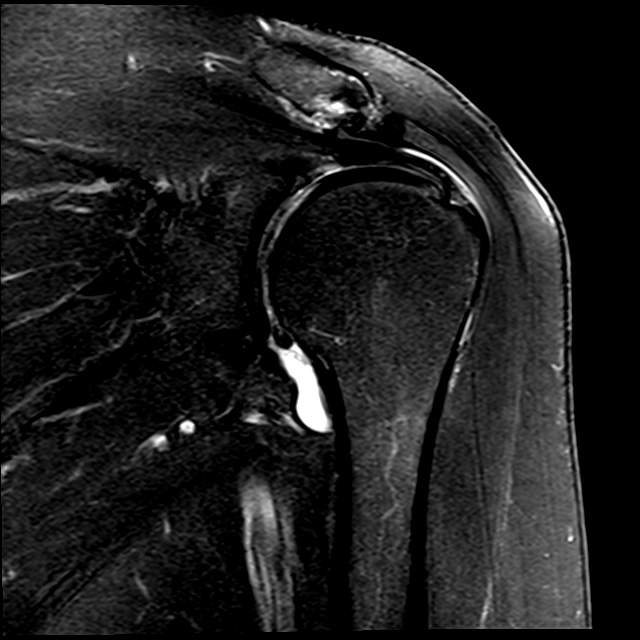
[im 17/21]
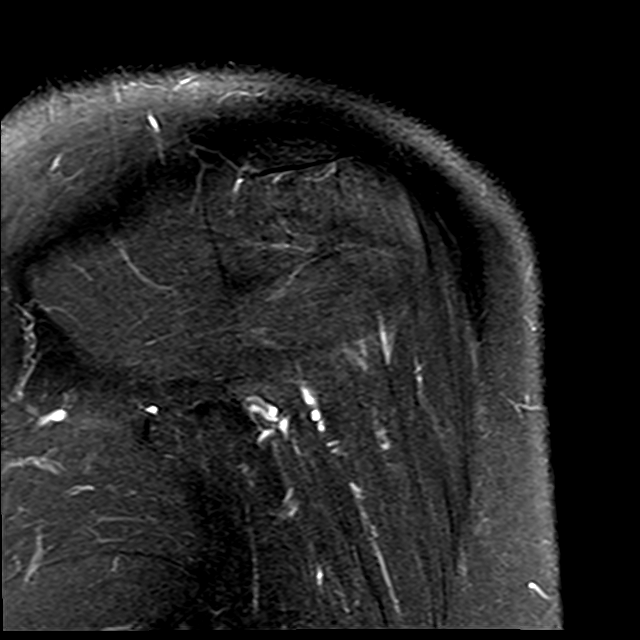

[21 of 40 positions shown; findings below may reference images not displayed]

FINDINGS: Rotator cuff: Mild tendinosis of the supraspinatus tendon.
Infraspinatus tendon is intact. Teres minor tendon is intact.
Subscapularis tendon is intact.

Muscles: No muscle atrophy or edema. No intramuscular fluid
collection or hematoma.

Biceps Long Head: Mild tendinosis of the intra-articular portion of
the long head of the biceps tendon.

Acromioclavicular Joint: Moderate arthropathy of the
acromioclavicular joint. No subacromial/subdeltoid bursal fluid.

Glenohumeral Joint: Small joint effusion. Partial-thickness
cartilage loss of the glenohumeral joint with small areas of
high-grade partial-thickness cartilage loss.

Labrum: Superior labral degeneration with fraying of the
posterosuperior labrum.

Bones: No fracture or dislocation. No aggressive osseous lesion.
Subcortical reactive marrow changes at the infraspinatus insertion.

Other: No fluid collection or hematoma.
IMPRESSION: 1. Mild tendinosis of the supraspinatus tendon.
2. Mild tendinosis of the intra-articular portion of the long head
of the biceps tendon.
3. Mild-moderate osteoarthritis of the glenohumeral joint.

## 2021-05-24 DIAGNOSIS — M67912 Unspecified disorder of synovium and tendon, left shoulder: Secondary | ICD-10-CM | POA: Diagnosis not present

## 2021-05-24 DIAGNOSIS — M24811 Other specific joint derangements of right shoulder, not elsewhere classified: Secondary | ICD-10-CM | POA: Diagnosis not present

## 2021-05-24 DIAGNOSIS — M67911 Unspecified disorder of synovium and tendon, right shoulder: Secondary | ICD-10-CM | POA: Diagnosis not present

## 2021-06-30 DIAGNOSIS — G8918 Other acute postprocedural pain: Secondary | ICD-10-CM | POA: Diagnosis not present

## 2021-06-30 DIAGNOSIS — M7541 Impingement syndrome of right shoulder: Secondary | ICD-10-CM | POA: Diagnosis not present

## 2021-06-30 DIAGNOSIS — M19011 Primary osteoarthritis, right shoulder: Secondary | ICD-10-CM | POA: Diagnosis not present

## 2021-07-12 DIAGNOSIS — M25611 Stiffness of right shoulder, not elsewhere classified: Secondary | ICD-10-CM | POA: Diagnosis not present

## 2021-07-12 DIAGNOSIS — M6281 Muscle weakness (generalized): Secondary | ICD-10-CM | POA: Diagnosis not present

## 2021-07-14 DIAGNOSIS — M6281 Muscle weakness (generalized): Secondary | ICD-10-CM | POA: Diagnosis not present

## 2021-07-14 DIAGNOSIS — M25611 Stiffness of right shoulder, not elsewhere classified: Secondary | ICD-10-CM | POA: Diagnosis not present

## 2021-07-20 DIAGNOSIS — M6281 Muscle weakness (generalized): Secondary | ICD-10-CM | POA: Diagnosis not present

## 2021-07-20 DIAGNOSIS — M25611 Stiffness of right shoulder, not elsewhere classified: Secondary | ICD-10-CM | POA: Diagnosis not present

## 2021-07-22 DIAGNOSIS — M25611 Stiffness of right shoulder, not elsewhere classified: Secondary | ICD-10-CM | POA: Diagnosis not present

## 2021-07-22 DIAGNOSIS — M6281 Muscle weakness (generalized): Secondary | ICD-10-CM | POA: Diagnosis not present

## 2021-07-27 DIAGNOSIS — M6281 Muscle weakness (generalized): Secondary | ICD-10-CM | POA: Diagnosis not present

## 2021-07-27 DIAGNOSIS — M25611 Stiffness of right shoulder, not elsewhere classified: Secondary | ICD-10-CM | POA: Diagnosis not present

## 2021-07-29 DIAGNOSIS — M25611 Stiffness of right shoulder, not elsewhere classified: Secondary | ICD-10-CM | POA: Diagnosis not present

## 2021-07-29 DIAGNOSIS — M6281 Muscle weakness (generalized): Secondary | ICD-10-CM | POA: Diagnosis not present

## 2021-08-03 DIAGNOSIS — M6281 Muscle weakness (generalized): Secondary | ICD-10-CM | POA: Diagnosis not present

## 2021-08-03 DIAGNOSIS — M25611 Stiffness of right shoulder, not elsewhere classified: Secondary | ICD-10-CM | POA: Diagnosis not present

## 2021-08-05 DIAGNOSIS — M6281 Muscle weakness (generalized): Secondary | ICD-10-CM | POA: Diagnosis not present

## 2021-08-05 DIAGNOSIS — M25611 Stiffness of right shoulder, not elsewhere classified: Secondary | ICD-10-CM | POA: Diagnosis not present

## 2021-08-09 DIAGNOSIS — M25611 Stiffness of right shoulder, not elsewhere classified: Secondary | ICD-10-CM | POA: Diagnosis not present

## 2021-08-09 DIAGNOSIS — M6281 Muscle weakness (generalized): Secondary | ICD-10-CM | POA: Diagnosis not present

## 2021-08-12 DIAGNOSIS — M6281 Muscle weakness (generalized): Secondary | ICD-10-CM | POA: Diagnosis not present

## 2021-08-12 DIAGNOSIS — M25611 Stiffness of right shoulder, not elsewhere classified: Secondary | ICD-10-CM | POA: Diagnosis not present

## 2021-08-17 DIAGNOSIS — M25611 Stiffness of right shoulder, not elsewhere classified: Secondary | ICD-10-CM | POA: Diagnosis not present

## 2021-08-17 DIAGNOSIS — M6281 Muscle weakness (generalized): Secondary | ICD-10-CM | POA: Diagnosis not present

## 2021-08-19 DIAGNOSIS — M6281 Muscle weakness (generalized): Secondary | ICD-10-CM | POA: Diagnosis not present

## 2021-08-19 DIAGNOSIS — M25611 Stiffness of right shoulder, not elsewhere classified: Secondary | ICD-10-CM | POA: Diagnosis not present

## 2021-08-20 DIAGNOSIS — I10 Essential (primary) hypertension: Secondary | ICD-10-CM | POA: Diagnosis not present

## 2021-08-20 DIAGNOSIS — M8580 Other specified disorders of bone density and structure, unspecified site: Secondary | ICD-10-CM | POA: Diagnosis not present

## 2021-08-20 DIAGNOSIS — Z01419 Encounter for gynecological examination (general) (routine) without abnormal findings: Secondary | ICD-10-CM | POA: Diagnosis not present

## 2021-08-20 DIAGNOSIS — Z780 Asymptomatic menopausal state: Secondary | ICD-10-CM | POA: Diagnosis not present

## 2021-08-25 DIAGNOSIS — Z23 Encounter for immunization: Secondary | ICD-10-CM | POA: Diagnosis not present

## 2021-08-25 DIAGNOSIS — M797 Fibromyalgia: Secondary | ICD-10-CM | POA: Diagnosis not present

## 2021-08-25 DIAGNOSIS — I10 Essential (primary) hypertension: Secondary | ICD-10-CM | POA: Diagnosis not present

## 2021-08-25 DIAGNOSIS — G43009 Migraine without aura, not intractable, without status migrainosus: Secondary | ICD-10-CM | POA: Diagnosis not present

## 2021-08-25 DIAGNOSIS — K219 Gastro-esophageal reflux disease without esophagitis: Secondary | ICD-10-CM | POA: Diagnosis not present

## 2021-09-09 DIAGNOSIS — Z1231 Encounter for screening mammogram for malignant neoplasm of breast: Secondary | ICD-10-CM | POA: Diagnosis not present

## 2022-01-12 DIAGNOSIS — Z1211 Encounter for screening for malignant neoplasm of colon: Secondary | ICD-10-CM | POA: Diagnosis not present

## 2022-02-16 DIAGNOSIS — M797 Fibromyalgia: Secondary | ICD-10-CM | POA: Diagnosis not present

## 2022-02-16 DIAGNOSIS — G43009 Migraine without aura, not intractable, without status migrainosus: Secondary | ICD-10-CM | POA: Diagnosis not present

## 2022-02-16 DIAGNOSIS — K219 Gastro-esophageal reflux disease without esophagitis: Secondary | ICD-10-CM | POA: Diagnosis not present

## 2022-02-16 DIAGNOSIS — I10 Essential (primary) hypertension: Secondary | ICD-10-CM | POA: Diagnosis not present

## 2022-02-28 DIAGNOSIS — M79605 Pain in left leg: Secondary | ICD-10-CM | POA: Diagnosis not present

## 2022-02-28 DIAGNOSIS — R2689 Other abnormalities of gait and mobility: Secondary | ICD-10-CM | POA: Diagnosis not present

## 2022-02-28 DIAGNOSIS — M542 Cervicalgia: Secondary | ICD-10-CM | POA: Diagnosis not present

## 2022-02-28 DIAGNOSIS — R293 Abnormal posture: Secondary | ICD-10-CM | POA: Diagnosis not present

## 2022-03-07 DIAGNOSIS — R2689 Other abnormalities of gait and mobility: Secondary | ICD-10-CM | POA: Diagnosis not present

## 2022-03-07 DIAGNOSIS — R293 Abnormal posture: Secondary | ICD-10-CM | POA: Diagnosis not present

## 2022-03-07 DIAGNOSIS — M79605 Pain in left leg: Secondary | ICD-10-CM | POA: Diagnosis not present

## 2022-03-07 DIAGNOSIS — M542 Cervicalgia: Secondary | ICD-10-CM | POA: Diagnosis not present

## 2022-03-10 DIAGNOSIS — M542 Cervicalgia: Secondary | ICD-10-CM | POA: Diagnosis not present

## 2022-03-10 DIAGNOSIS — R293 Abnormal posture: Secondary | ICD-10-CM | POA: Diagnosis not present

## 2022-03-10 DIAGNOSIS — M79605 Pain in left leg: Secondary | ICD-10-CM | POA: Diagnosis not present

## 2022-03-10 DIAGNOSIS — R2689 Other abnormalities of gait and mobility: Secondary | ICD-10-CM | POA: Diagnosis not present

## 2022-03-14 DIAGNOSIS — M542 Cervicalgia: Secondary | ICD-10-CM | POA: Diagnosis not present

## 2022-03-14 DIAGNOSIS — R293 Abnormal posture: Secondary | ICD-10-CM | POA: Diagnosis not present

## 2022-03-14 DIAGNOSIS — M79605 Pain in left leg: Secondary | ICD-10-CM | POA: Diagnosis not present

## 2022-03-14 DIAGNOSIS — R2689 Other abnormalities of gait and mobility: Secondary | ICD-10-CM | POA: Diagnosis not present

## 2022-03-17 DIAGNOSIS — R293 Abnormal posture: Secondary | ICD-10-CM | POA: Diagnosis not present

## 2022-03-17 DIAGNOSIS — M79605 Pain in left leg: Secondary | ICD-10-CM | POA: Diagnosis not present

## 2022-03-17 DIAGNOSIS — M542 Cervicalgia: Secondary | ICD-10-CM | POA: Diagnosis not present

## 2022-03-17 DIAGNOSIS — R2689 Other abnormalities of gait and mobility: Secondary | ICD-10-CM | POA: Diagnosis not present

## 2022-03-21 DIAGNOSIS — R2689 Other abnormalities of gait and mobility: Secondary | ICD-10-CM | POA: Diagnosis not present

## 2022-03-21 DIAGNOSIS — R293 Abnormal posture: Secondary | ICD-10-CM | POA: Diagnosis not present

## 2022-03-21 DIAGNOSIS — M542 Cervicalgia: Secondary | ICD-10-CM | POA: Diagnosis not present

## 2022-03-21 DIAGNOSIS — M79605 Pain in left leg: Secondary | ICD-10-CM | POA: Diagnosis not present

## 2022-03-23 DIAGNOSIS — I10 Essential (primary) hypertension: Secondary | ICD-10-CM | POA: Diagnosis not present

## 2022-03-23 DIAGNOSIS — H43393 Other vitreous opacities, bilateral: Secondary | ICD-10-CM | POA: Diagnosis not present

## 2022-03-23 DIAGNOSIS — H40013 Open angle with borderline findings, low risk, bilateral: Secondary | ICD-10-CM | POA: Diagnosis not present

## 2022-03-23 DIAGNOSIS — H16223 Keratoconjunctivitis sicca, not specified as Sjogren's, bilateral: Secondary | ICD-10-CM | POA: Diagnosis not present

## 2022-04-01 DIAGNOSIS — M79605 Pain in left leg: Secondary | ICD-10-CM | POA: Diagnosis not present

## 2022-04-01 DIAGNOSIS — R293 Abnormal posture: Secondary | ICD-10-CM | POA: Diagnosis not present

## 2022-04-01 DIAGNOSIS — R2689 Other abnormalities of gait and mobility: Secondary | ICD-10-CM | POA: Diagnosis not present

## 2022-04-01 DIAGNOSIS — M542 Cervicalgia: Secondary | ICD-10-CM | POA: Diagnosis not present

## 2022-04-05 DIAGNOSIS — M79605 Pain in left leg: Secondary | ICD-10-CM | POA: Diagnosis not present

## 2022-04-05 DIAGNOSIS — R293 Abnormal posture: Secondary | ICD-10-CM | POA: Diagnosis not present

## 2022-04-05 DIAGNOSIS — M542 Cervicalgia: Secondary | ICD-10-CM | POA: Diagnosis not present

## 2022-04-05 DIAGNOSIS — R2689 Other abnormalities of gait and mobility: Secondary | ICD-10-CM | POA: Diagnosis not present

## 2022-04-21 DIAGNOSIS — R2689 Other abnormalities of gait and mobility: Secondary | ICD-10-CM | POA: Diagnosis not present

## 2022-04-21 DIAGNOSIS — M79605 Pain in left leg: Secondary | ICD-10-CM | POA: Diagnosis not present

## 2022-04-21 DIAGNOSIS — M542 Cervicalgia: Secondary | ICD-10-CM | POA: Diagnosis not present

## 2022-04-21 DIAGNOSIS — R293 Abnormal posture: Secondary | ICD-10-CM | POA: Diagnosis not present

## 2022-05-04 DIAGNOSIS — M79605 Pain in left leg: Secondary | ICD-10-CM | POA: Diagnosis not present

## 2022-05-04 DIAGNOSIS — M542 Cervicalgia: Secondary | ICD-10-CM | POA: Diagnosis not present

## 2022-05-04 DIAGNOSIS — R2689 Other abnormalities of gait and mobility: Secondary | ICD-10-CM | POA: Diagnosis not present

## 2022-05-04 DIAGNOSIS — R293 Abnormal posture: Secondary | ICD-10-CM | POA: Diagnosis not present

## 2022-05-10 DIAGNOSIS — M79605 Pain in left leg: Secondary | ICD-10-CM | POA: Diagnosis not present

## 2022-05-10 DIAGNOSIS — R2689 Other abnormalities of gait and mobility: Secondary | ICD-10-CM | POA: Diagnosis not present

## 2022-05-10 DIAGNOSIS — M542 Cervicalgia: Secondary | ICD-10-CM | POA: Diagnosis not present

## 2022-05-10 DIAGNOSIS — R293 Abnormal posture: Secondary | ICD-10-CM | POA: Diagnosis not present

## 2022-05-18 DIAGNOSIS — R293 Abnormal posture: Secondary | ICD-10-CM | POA: Diagnosis not present

## 2022-05-18 DIAGNOSIS — R2689 Other abnormalities of gait and mobility: Secondary | ICD-10-CM | POA: Diagnosis not present

## 2022-05-18 DIAGNOSIS — M542 Cervicalgia: Secondary | ICD-10-CM | POA: Diagnosis not present

## 2022-05-18 DIAGNOSIS — M79605 Pain in left leg: Secondary | ICD-10-CM | POA: Diagnosis not present

## 2022-05-19 DIAGNOSIS — M797 Fibromyalgia: Secondary | ICD-10-CM | POA: Diagnosis not present

## 2022-05-19 DIAGNOSIS — I10 Essential (primary) hypertension: Secondary | ICD-10-CM | POA: Diagnosis not present

## 2022-05-19 DIAGNOSIS — G43009 Migraine without aura, not intractable, without status migrainosus: Secondary | ICD-10-CM | POA: Diagnosis not present

## 2022-05-24 DIAGNOSIS — R2689 Other abnormalities of gait and mobility: Secondary | ICD-10-CM | POA: Diagnosis not present

## 2022-05-24 DIAGNOSIS — M542 Cervicalgia: Secondary | ICD-10-CM | POA: Diagnosis not present

## 2022-05-24 DIAGNOSIS — M79605 Pain in left leg: Secondary | ICD-10-CM | POA: Diagnosis not present

## 2022-05-24 DIAGNOSIS — R293 Abnormal posture: Secondary | ICD-10-CM | POA: Diagnosis not present

## 2022-06-14 DIAGNOSIS — M79605 Pain in left leg: Secondary | ICD-10-CM | POA: Diagnosis not present

## 2022-06-14 DIAGNOSIS — R2689 Other abnormalities of gait and mobility: Secondary | ICD-10-CM | POA: Diagnosis not present

## 2022-06-14 DIAGNOSIS — M542 Cervicalgia: Secondary | ICD-10-CM | POA: Diagnosis not present

## 2022-06-14 DIAGNOSIS — R293 Abnormal posture: Secondary | ICD-10-CM | POA: Diagnosis not present

## 2022-06-28 DIAGNOSIS — R293 Abnormal posture: Secondary | ICD-10-CM | POA: Diagnosis not present

## 2022-06-28 DIAGNOSIS — M542 Cervicalgia: Secondary | ICD-10-CM | POA: Diagnosis not present

## 2022-06-28 DIAGNOSIS — M79605 Pain in left leg: Secondary | ICD-10-CM | POA: Diagnosis not present

## 2022-06-28 DIAGNOSIS — R2689 Other abnormalities of gait and mobility: Secondary | ICD-10-CM | POA: Diagnosis not present

## 2022-07-13 DIAGNOSIS — M542 Cervicalgia: Secondary | ICD-10-CM | POA: Diagnosis not present

## 2022-07-13 DIAGNOSIS — R2689 Other abnormalities of gait and mobility: Secondary | ICD-10-CM | POA: Diagnosis not present

## 2022-07-13 DIAGNOSIS — R293 Abnormal posture: Secondary | ICD-10-CM | POA: Diagnosis not present

## 2022-07-13 DIAGNOSIS — M79605 Pain in left leg: Secondary | ICD-10-CM | POA: Diagnosis not present

## 2022-07-26 DIAGNOSIS — M79605 Pain in left leg: Secondary | ICD-10-CM | POA: Diagnosis not present

## 2022-07-26 DIAGNOSIS — R2689 Other abnormalities of gait and mobility: Secondary | ICD-10-CM | POA: Diagnosis not present

## 2022-07-26 DIAGNOSIS — M542 Cervicalgia: Secondary | ICD-10-CM | POA: Diagnosis not present

## 2022-07-26 DIAGNOSIS — R293 Abnormal posture: Secondary | ICD-10-CM | POA: Diagnosis not present

## 2022-08-11 DIAGNOSIS — R2689 Other abnormalities of gait and mobility: Secondary | ICD-10-CM | POA: Diagnosis not present

## 2022-08-11 DIAGNOSIS — M79605 Pain in left leg: Secondary | ICD-10-CM | POA: Diagnosis not present

## 2022-08-11 DIAGNOSIS — R293 Abnormal posture: Secondary | ICD-10-CM | POA: Diagnosis not present

## 2022-08-11 DIAGNOSIS — M542 Cervicalgia: Secondary | ICD-10-CM | POA: Diagnosis not present

## 2022-08-23 DIAGNOSIS — M8580 Other specified disorders of bone density and structure, unspecified site: Secondary | ICD-10-CM | POA: Diagnosis not present

## 2022-08-23 DIAGNOSIS — Z01419 Encounter for gynecological examination (general) (routine) without abnormal findings: Secondary | ICD-10-CM | POA: Diagnosis not present

## 2022-08-23 DIAGNOSIS — I10 Essential (primary) hypertension: Secondary | ICD-10-CM | POA: Diagnosis not present

## 2022-08-23 DIAGNOSIS — M797 Fibromyalgia: Secondary | ICD-10-CM | POA: Diagnosis not present

## 2022-08-24 DIAGNOSIS — M797 Fibromyalgia: Secondary | ICD-10-CM | POA: Diagnosis not present

## 2022-08-24 DIAGNOSIS — K219 Gastro-esophageal reflux disease without esophagitis: Secondary | ICD-10-CM | POA: Diagnosis not present

## 2022-08-24 DIAGNOSIS — I10 Essential (primary) hypertension: Secondary | ICD-10-CM | POA: Diagnosis not present

## 2022-08-24 DIAGNOSIS — Z23 Encounter for immunization: Secondary | ICD-10-CM | POA: Diagnosis not present

## 2022-08-30 DIAGNOSIS — M542 Cervicalgia: Secondary | ICD-10-CM | POA: Diagnosis not present

## 2022-08-30 DIAGNOSIS — M79605 Pain in left leg: Secondary | ICD-10-CM | POA: Diagnosis not present

## 2022-08-30 DIAGNOSIS — R2689 Other abnormalities of gait and mobility: Secondary | ICD-10-CM | POA: Diagnosis not present

## 2022-08-30 DIAGNOSIS — R293 Abnormal posture: Secondary | ICD-10-CM | POA: Diagnosis not present

## 2022-09-12 DIAGNOSIS — H04123 Dry eye syndrome of bilateral lacrimal glands: Secondary | ICD-10-CM | POA: Diagnosis not present

## 2022-09-13 DIAGNOSIS — M542 Cervicalgia: Secondary | ICD-10-CM | POA: Diagnosis not present

## 2022-09-13 DIAGNOSIS — M79605 Pain in left leg: Secondary | ICD-10-CM | POA: Diagnosis not present

## 2022-09-13 DIAGNOSIS — R2689 Other abnormalities of gait and mobility: Secondary | ICD-10-CM | POA: Diagnosis not present

## 2022-09-13 DIAGNOSIS — R293 Abnormal posture: Secondary | ICD-10-CM | POA: Diagnosis not present

## 2022-09-15 DIAGNOSIS — Z1231 Encounter for screening mammogram for malignant neoplasm of breast: Secondary | ICD-10-CM | POA: Diagnosis not present

## 2022-09-27 DIAGNOSIS — R293 Abnormal posture: Secondary | ICD-10-CM | POA: Diagnosis not present

## 2022-09-27 DIAGNOSIS — M542 Cervicalgia: Secondary | ICD-10-CM | POA: Diagnosis not present

## 2022-09-27 DIAGNOSIS — R2689 Other abnormalities of gait and mobility: Secondary | ICD-10-CM | POA: Diagnosis not present

## 2022-09-27 DIAGNOSIS — M79605 Pain in left leg: Secondary | ICD-10-CM | POA: Diagnosis not present

## 2022-10-11 DIAGNOSIS — R2689 Other abnormalities of gait and mobility: Secondary | ICD-10-CM | POA: Diagnosis not present

## 2022-10-11 DIAGNOSIS — R293 Abnormal posture: Secondary | ICD-10-CM | POA: Diagnosis not present

## 2022-10-11 DIAGNOSIS — M79605 Pain in left leg: Secondary | ICD-10-CM | POA: Diagnosis not present

## 2022-10-11 DIAGNOSIS — M542 Cervicalgia: Secondary | ICD-10-CM | POA: Diagnosis not present

## 2022-10-18 DIAGNOSIS — M79605 Pain in left leg: Secondary | ICD-10-CM | POA: Diagnosis not present

## 2022-10-18 DIAGNOSIS — R2689 Other abnormalities of gait and mobility: Secondary | ICD-10-CM | POA: Diagnosis not present

## 2022-10-18 DIAGNOSIS — M542 Cervicalgia: Secondary | ICD-10-CM | POA: Diagnosis not present

## 2022-10-18 DIAGNOSIS — R293 Abnormal posture: Secondary | ICD-10-CM | POA: Diagnosis not present

## 2022-11-10 DIAGNOSIS — R293 Abnormal posture: Secondary | ICD-10-CM | POA: Diagnosis not present

## 2022-11-10 DIAGNOSIS — M542 Cervicalgia: Secondary | ICD-10-CM | POA: Diagnosis not present

## 2022-11-10 DIAGNOSIS — R2689 Other abnormalities of gait and mobility: Secondary | ICD-10-CM | POA: Diagnosis not present

## 2022-11-10 DIAGNOSIS — M79605 Pain in left leg: Secondary | ICD-10-CM | POA: Diagnosis not present

## 2022-11-29 DIAGNOSIS — M542 Cervicalgia: Secondary | ICD-10-CM | POA: Diagnosis not present

## 2022-11-29 DIAGNOSIS — M79605 Pain in left leg: Secondary | ICD-10-CM | POA: Diagnosis not present

## 2022-11-29 DIAGNOSIS — R293 Abnormal posture: Secondary | ICD-10-CM | POA: Diagnosis not present

## 2022-11-29 DIAGNOSIS — R2689 Other abnormalities of gait and mobility: Secondary | ICD-10-CM | POA: Diagnosis not present

## 2022-12-13 DIAGNOSIS — R2689 Other abnormalities of gait and mobility: Secondary | ICD-10-CM | POA: Diagnosis not present

## 2022-12-13 DIAGNOSIS — M542 Cervicalgia: Secondary | ICD-10-CM | POA: Diagnosis not present

## 2022-12-13 DIAGNOSIS — R293 Abnormal posture: Secondary | ICD-10-CM | POA: Diagnosis not present

## 2022-12-13 DIAGNOSIS — M79605 Pain in left leg: Secondary | ICD-10-CM | POA: Diagnosis not present

## 2022-12-27 DIAGNOSIS — R293 Abnormal posture: Secondary | ICD-10-CM | POA: Diagnosis not present

## 2022-12-27 DIAGNOSIS — R2689 Other abnormalities of gait and mobility: Secondary | ICD-10-CM | POA: Diagnosis not present

## 2022-12-27 DIAGNOSIS — M542 Cervicalgia: Secondary | ICD-10-CM | POA: Diagnosis not present

## 2022-12-27 DIAGNOSIS — M79605 Pain in left leg: Secondary | ICD-10-CM | POA: Diagnosis not present

## 2023-01-10 DIAGNOSIS — R2689 Other abnormalities of gait and mobility: Secondary | ICD-10-CM | POA: Diagnosis not present

## 2023-01-10 DIAGNOSIS — M79605 Pain in left leg: Secondary | ICD-10-CM | POA: Diagnosis not present

## 2023-01-10 DIAGNOSIS — M542 Cervicalgia: Secondary | ICD-10-CM | POA: Diagnosis not present

## 2023-01-10 DIAGNOSIS — R293 Abnormal posture: Secondary | ICD-10-CM | POA: Diagnosis not present

## 2023-01-24 DIAGNOSIS — R2689 Other abnormalities of gait and mobility: Secondary | ICD-10-CM | POA: Diagnosis not present

## 2023-01-24 DIAGNOSIS — M79605 Pain in left leg: Secondary | ICD-10-CM | POA: Diagnosis not present

## 2023-01-24 DIAGNOSIS — M542 Cervicalgia: Secondary | ICD-10-CM | POA: Diagnosis not present

## 2023-01-24 DIAGNOSIS — R293 Abnormal posture: Secondary | ICD-10-CM | POA: Diagnosis not present

## 2023-02-22 DIAGNOSIS — M797 Fibromyalgia: Secondary | ICD-10-CM | POA: Diagnosis not present

## 2023-02-22 DIAGNOSIS — G43009 Migraine without aura, not intractable, without status migrainosus: Secondary | ICD-10-CM | POA: Diagnosis not present

## 2023-02-22 DIAGNOSIS — I10 Essential (primary) hypertension: Secondary | ICD-10-CM | POA: Diagnosis not present

## 2023-02-22 DIAGNOSIS — E669 Obesity, unspecified: Secondary | ICD-10-CM | POA: Diagnosis not present

## 2023-02-28 DIAGNOSIS — R293 Abnormal posture: Secondary | ICD-10-CM | POA: Diagnosis not present

## 2023-02-28 DIAGNOSIS — M542 Cervicalgia: Secondary | ICD-10-CM | POA: Diagnosis not present

## 2023-02-28 DIAGNOSIS — R2689 Other abnormalities of gait and mobility: Secondary | ICD-10-CM | POA: Diagnosis not present

## 2023-02-28 DIAGNOSIS — M79605 Pain in left leg: Secondary | ICD-10-CM | POA: Diagnosis not present

## 2023-03-21 DIAGNOSIS — R2689 Other abnormalities of gait and mobility: Secondary | ICD-10-CM | POA: Diagnosis not present

## 2023-03-21 DIAGNOSIS — M79605 Pain in left leg: Secondary | ICD-10-CM | POA: Diagnosis not present

## 2023-03-21 DIAGNOSIS — M542 Cervicalgia: Secondary | ICD-10-CM | POA: Diagnosis not present

## 2023-03-21 DIAGNOSIS — R293 Abnormal posture: Secondary | ICD-10-CM | POA: Diagnosis not present

## 2023-04-20 DIAGNOSIS — R293 Abnormal posture: Secondary | ICD-10-CM | POA: Diagnosis not present

## 2023-04-20 DIAGNOSIS — M542 Cervicalgia: Secondary | ICD-10-CM | POA: Diagnosis not present

## 2023-04-20 DIAGNOSIS — R2689 Other abnormalities of gait and mobility: Secondary | ICD-10-CM | POA: Diagnosis not present

## 2023-04-20 DIAGNOSIS — M79605 Pain in left leg: Secondary | ICD-10-CM | POA: Diagnosis not present

## 2023-05-09 DIAGNOSIS — R2689 Other abnormalities of gait and mobility: Secondary | ICD-10-CM | POA: Diagnosis not present

## 2023-05-09 DIAGNOSIS — M542 Cervicalgia: Secondary | ICD-10-CM | POA: Diagnosis not present

## 2023-05-09 DIAGNOSIS — R293 Abnormal posture: Secondary | ICD-10-CM | POA: Diagnosis not present

## 2023-05-09 DIAGNOSIS — M79605 Pain in left leg: Secondary | ICD-10-CM | POA: Diagnosis not present

## 2023-05-18 DIAGNOSIS — R293 Abnormal posture: Secondary | ICD-10-CM | POA: Diagnosis not present

## 2023-05-18 DIAGNOSIS — M542 Cervicalgia: Secondary | ICD-10-CM | POA: Diagnosis not present

## 2023-05-18 DIAGNOSIS — M79605 Pain in left leg: Secondary | ICD-10-CM | POA: Diagnosis not present

## 2023-05-18 DIAGNOSIS — R2689 Other abnormalities of gait and mobility: Secondary | ICD-10-CM | POA: Diagnosis not present

## 2023-06-22 DIAGNOSIS — M79605 Pain in left leg: Secondary | ICD-10-CM | POA: Diagnosis not present

## 2023-06-22 DIAGNOSIS — M542 Cervicalgia: Secondary | ICD-10-CM | POA: Diagnosis not present

## 2023-06-22 DIAGNOSIS — R2689 Other abnormalities of gait and mobility: Secondary | ICD-10-CM | POA: Diagnosis not present

## 2023-06-22 DIAGNOSIS — R293 Abnormal posture: Secondary | ICD-10-CM | POA: Diagnosis not present

## 2023-07-20 DIAGNOSIS — R293 Abnormal posture: Secondary | ICD-10-CM | POA: Diagnosis not present

## 2023-07-20 DIAGNOSIS — M542 Cervicalgia: Secondary | ICD-10-CM | POA: Diagnosis not present

## 2023-07-20 DIAGNOSIS — M79605 Pain in left leg: Secondary | ICD-10-CM | POA: Diagnosis not present

## 2023-07-20 DIAGNOSIS — R2689 Other abnormalities of gait and mobility: Secondary | ICD-10-CM | POA: Diagnosis not present

## 2023-08-16 DIAGNOSIS — Z23 Encounter for immunization: Secondary | ICD-10-CM | POA: Diagnosis not present

## 2023-08-16 DIAGNOSIS — K219 Gastro-esophageal reflux disease without esophagitis: Secondary | ICD-10-CM | POA: Diagnosis not present

## 2023-08-16 DIAGNOSIS — E669 Obesity, unspecified: Secondary | ICD-10-CM | POA: Diagnosis not present

## 2023-08-16 DIAGNOSIS — M797 Fibromyalgia: Secondary | ICD-10-CM | POA: Diagnosis not present

## 2023-08-16 DIAGNOSIS — I10 Essential (primary) hypertension: Secondary | ICD-10-CM | POA: Diagnosis not present

## 2023-08-22 DIAGNOSIS — R293 Abnormal posture: Secondary | ICD-10-CM | POA: Diagnosis not present

## 2023-08-22 DIAGNOSIS — R2689 Other abnormalities of gait and mobility: Secondary | ICD-10-CM | POA: Diagnosis not present

## 2023-08-22 DIAGNOSIS — M79605 Pain in left leg: Secondary | ICD-10-CM | POA: Diagnosis not present

## 2023-08-22 DIAGNOSIS — M542 Cervicalgia: Secondary | ICD-10-CM | POA: Diagnosis not present

## 2023-08-29 DIAGNOSIS — Z01419 Encounter for gynecological examination (general) (routine) without abnormal findings: Secondary | ICD-10-CM | POA: Diagnosis not present

## 2023-09-19 DIAGNOSIS — M542 Cervicalgia: Secondary | ICD-10-CM | POA: Diagnosis not present

## 2023-09-19 DIAGNOSIS — R2689 Other abnormalities of gait and mobility: Secondary | ICD-10-CM | POA: Diagnosis not present

## 2023-09-19 DIAGNOSIS — M79605 Pain in left leg: Secondary | ICD-10-CM | POA: Diagnosis not present

## 2023-09-19 DIAGNOSIS — R293 Abnormal posture: Secondary | ICD-10-CM | POA: Diagnosis not present

## 2023-09-26 DIAGNOSIS — Z1231 Encounter for screening mammogram for malignant neoplasm of breast: Secondary | ICD-10-CM | POA: Diagnosis not present

## 2023-10-19 DIAGNOSIS — M79605 Pain in left leg: Secondary | ICD-10-CM | POA: Diagnosis not present

## 2023-10-19 DIAGNOSIS — M542 Cervicalgia: Secondary | ICD-10-CM | POA: Diagnosis not present

## 2023-10-19 DIAGNOSIS — R293 Abnormal posture: Secondary | ICD-10-CM | POA: Diagnosis not present

## 2023-10-19 DIAGNOSIS — R2689 Other abnormalities of gait and mobility: Secondary | ICD-10-CM | POA: Diagnosis not present

## 2024-01-29 DIAGNOSIS — H01005 Unspecified blepharitis left lower eyelid: Secondary | ICD-10-CM | POA: Diagnosis not present

## 2024-01-29 DIAGNOSIS — H02885 Meibomian gland dysfunction left lower eyelid: Secondary | ICD-10-CM | POA: Diagnosis not present

## 2024-01-29 DIAGNOSIS — H02882 Meibomian gland dysfunction right lower eyelid: Secondary | ICD-10-CM | POA: Diagnosis not present

## 2024-01-29 DIAGNOSIS — H01002 Unspecified blepharitis right lower eyelid: Secondary | ICD-10-CM | POA: Diagnosis not present

## 2024-02-19 ENCOUNTER — Other Ambulatory Visit: Payer: Self-pay | Admitting: Family Medicine

## 2024-02-19 DIAGNOSIS — R519 Headache, unspecified: Secondary | ICD-10-CM

## 2024-02-19 DIAGNOSIS — I10 Essential (primary) hypertension: Secondary | ICD-10-CM | POA: Diagnosis not present

## 2024-02-19 DIAGNOSIS — M797 Fibromyalgia: Secondary | ICD-10-CM | POA: Diagnosis not present

## 2024-02-19 DIAGNOSIS — R42 Dizziness and giddiness: Secondary | ICD-10-CM | POA: Diagnosis not present

## 2024-02-19 DIAGNOSIS — E669 Obesity, unspecified: Secondary | ICD-10-CM | POA: Diagnosis not present

## 2024-02-20 ENCOUNTER — Encounter: Payer: Self-pay | Admitting: Family Medicine

## 2024-02-29 ENCOUNTER — Ambulatory Visit
Admission: RE | Admit: 2024-02-29 | Discharge: 2024-02-29 | Disposition: A | Discharge status: Home / Self Care | Source: Ambulatory Visit | Attending: Family Medicine | Admitting: Family Medicine

## 2024-02-29 DIAGNOSIS — R519 Headache, unspecified: Secondary | ICD-10-CM

## 2024-02-29 MED ORDER — GADOPICLENOL 0.5 MMOL/ML IV SOLN
7.5000 mL | Freq: Once | INTRAVENOUS | Status: AC | PRN
  Administered 2024-02-29: 7.5 mL via INTRAVENOUS

## 2024-04-30 ENCOUNTER — Other Ambulatory Visit: Payer: Self-pay

## 2024-05-27 NOTE — Progress Notes (Unsigned)
 Cardiology CONSULT Note    Date:  05/28/2024   ID:  [name], DOB 02-19-1961, MRN [id number]  PCP:  Teresa Channel, MD  Cardiologist:  Wilbert Bihari, MD   Chief Complaint  Patient presents with   New Patient (Initial Visit)    Syncope     Patient Profile: [name] is a 63 y.o. female who is being seen today for the evaluation of Syncope at the request of Teresa Channel, MD.  History of Present Illness:  [name] is a 63 y.o. female who is being seen today for the evaluation of Syncope at the request of Teresa Channel, MD.  This is a 63yo female with a hx of fibromyalgia, GERD, HTN and syncope.  She has a hx of syncope dating back to age 43.  She was referred to Dr. Blanca in 2018 and workup with echo and heart monitor were normal per patient.  Recently she has been having more episodes of syncope that are different from what she had in 2018.  It used to be that she would wake up right after the event and would take a long time to feel back to normal.    Seen by her PCP in April 2025 complaining of problems with dizziness and had a fall with no warning.  She apparently was changing clothes and turned to leave the room and then ended up on the floor.  There were no prodromal symptoms like she usually gets.  There was no witnessed seizure activity, incontinence but did bite her lip with the fall.  She was confused when she woke up but no incontinence.   Usually the episodes occur more when going from sitting to standing.  Recently she has been dizzy constantly.  She cannot bend over or stand for too long a period due to dizziness.  The last time she passed out in March she has had a constant HA.  She had a head MRI that was normal.  She has had a hx of migraine HAs for years.   She has had 2 episodes where she passed out while sitting.  When she was a child she had 1 episode while in a warm room playing a clarinet. She had another episode sitting at a restaurant.  She has been  under some stress at home. She takes Ambien, Naprosyn and Tizanidine for fibromyalgia.   She tells me that prior to her events she feels pressure on her shoulders, her face gets hot and then everything gets very bright and she passes.  She denies any nausea or diaphoresis or palpitations prior to the events.    Past Medical History:  Diagnosis Date   Dizziness    Dizziness    Fibromyalgia    Gallstone    GERD (gastroesophageal reflux disease)    Headaches, cluster    HTN (hypertension)    Migraines    Multinodular goiter    Osteopenia    Over weight    Shoulder pain    Vasovagal syncope       Current Medications: Current Meds  Medication Sig   AMBIEN 10 MG tablet Take 10 mg by mouth at bedtime.   BIOTIN PO 1 capsule.   CALCIUM PO Take by mouth.   desoximetasone (TOPICORT) 0.25 % cream Apply 1 Application topically 2 (two) times daily.   losartan-hydrochlorothiazide (HYZAAR) 100-25 MG tablet Take 1 tablet by mouth daily.   Melatonin 10 MG CAPS Take 10 mg by mouth at bedtime as  needed.   NAPROSYN 500 MG tablet Take 500 mg by mouth daily as needed.   omega-3 acid ethyl esters (LOVAZA) 1 g capsule Take 1 g by mouth 2 (two) times daily.   omeprazole (PRILOSEC) 40 MG capsule Take 40 mg by mouth daily.   Perfluorohexyloctane (MIEBO OP) Apply to eye.   rizatriptan (MAXALT) 10 MG tablet Take 10 mg by mouth as needed for migraine. May repeat in 2 hours if needed   tiZANidine (ZANAFLEX) 2 MG tablet Take 2 mg by mouth every 6 (six) hours as needed for muscle spasms. TAKE 1-2 TABLETS PRN AT BEDTIME   vitamin D3 (CHOLECALCIFEROL) 25 MCG tablet Take 1,000 Units by mouth daily.    Allergies:   Cyclobenzaprine, Cymbalta [duloxetine hcl], and Lisinopril   Social History   Socioeconomic History   Marital status: Single    Spouse name: Not on file   Number of children: Not on file   Years of education: Not on file   Highest education level: Not on file  Occupational History   Not on  file  Tobacco Use   Smoking status: Never   Smokeless tobacco: Never  Substance and Sexual Activity   Alcohol use: Yes   Drug use: Never   Sexual activity: Not on file  Other Topics Concern   Not on file  Social History Narrative   Not on file   Social Drivers of Health   Financial Resource Strain: Not on file  Food Insecurity: Not on file  Transportation Needs: Not on file  Physical Activity: Not on file  Stress: Not on file  Social Connections: Not on file     Family History:  The patient's family history is not on file.   ROS:   Please see the history of present illness.    ROS All other systems reviewed and are negative.     05/21/2024   12:04 PM  PAD Screen  Previous PAD dx? No  Previous surgical procedure? No  Pain with walking? No  Feet/toe relief with dangling? No  Painful, non-healing ulcers? No  Extremities discolored? No       PHYSICAL EXAM:   VS:  BP (!) 150/88   Pulse 73   Ht 5' 4.75 (1.645 m)   Wt 185 lb 9.6 oz (84.2 kg)   SpO2 99%   BMI 31.12 kg/m     GEN: Well nourished, well developed, in no acute distress  HEENT: normal  Neck: no JVD, carotid bruits, or masses Cardiac: RRR; no murmurs, rubs, or gallops,no edema.  Intact distal pulses bilaterally.  Respiratory:  clear to auscultation bilaterally, normal work of breathing GI: soft, nontender, nondistended, + BS MS: no deformity or atrophy  Skin: warm and dry, no rash Neuro:  Alert and Oriented x 3, Strength and sensation are intact Psych: euthymic mood, full affect  Wt Readings from Last 3 Encounters:  05/28/24 185 lb 9.6 oz (84.2 kg)      Studies/Labs Reviewed:   EKG Interpretation Date/Time:  Tuesday May 28 2024 12:43:36 EDT Ventricular Rate:  73 PR Interval:  122 QRS Duration:  72 QT Interval:  384 QTC Calculation: 423 R Axis:   18  Text Interpretation: Normal sinus rhythm Septal infarct , age undetermined When compared with ECG of 12-Sep-2007 01:22, Septal infarct is  now Present Nonspecific T wave abnormality now evident in Anterior leads Confirmed by Shlomo Corning ([id number]) on 05/28/2024 1:02:21 PM       Recent Labs: No results found for  requested labs within last 365 days.   Lipid Panel No results found for: CHOL, TRIG, HDL, CHOLHDL, VLDL, LDLCALC, LDLDIRECT  Additional studies/ records that were reviewed today include:  oV notes by PCP    ASSESSMENT:    1. Syncope, unspecified syncope type      PLAN:  In order of problems listed above:  #Syncope -she has had multiple episodes of syncope throughout the years -her PCP felt the episodes were vasovagal in nature but they tend to occur mostly when going from sitting to standing although, she has had 2 episodes in the past where it occurred while seated. -Orthostatic BPs today showed a greater than 20 mm drop in systolic blood pressure from sitting to standing which improved after 3 minutes of standing.  This did come along with dizziness -Unclear whether these episodes are vasovagal but atypical and that she gets no prodrome or related to orthostasis but she also has had some when she sitting -will check a 30 day heart monitor to assess for arrhythmias and if normal consider ILR placement. -check 2D echo to assess LVF -I will get a Stress myoview to rule out ischemia -Informed Consent   Shared Decision Making/Informed Consent The risks [chest pain, shortness of breath, cardiac arrhythmias, dizziness, blood pressure fluctuations, myocardial infarction, stroke/transient ischemic attack, nausea, vomiting, allergic reaction, radiation exposure, metallic taste sensation and life-threatening complications (estimated to be 1 in 10,000)], benefits (risk stratification, diagnosing coronary artery disease, treatment guidance) and alternatives of a nuclear stress test were discussed in detail with Ms. Menefee and she agrees to proceed.    -if all workup normal then will refer to EP for  ILR  #HTN -she had bee on Hyzaar 100-25mg  daily but her PCP dropped the dose to 50-12.5mg  daily -BP borderline elevated today  Time Spent: 20 minutes total time of encounter, including 15 minutes spent in face-to-face patient care on the date of this encounter. This time includes coordination of care and counseling regarding above mentioned problem list. Remainder of non-face-to-face time involved reviewing chart documents/testing relevant to the patient encounter and documentation in the medical record. I have independently reviewed documentation from referring provider  Followup:  PRN  Medication Adjustments/Labs and Tests Ordered: Current medicines are reviewed at length with the patient today.  Concerns regarding medicines are outlined above.  Medication changes, Labs and Tests ordered today are listed in the Patient Instructions below.  There are no Patient Instructions on file for this visit.   Signed, Wilbert Bihari, MD  05/28/2024 1:03 PM    [facility] [address] [place], [place], [place]  [address] Phone: [phone]; Fax: [phone]

## 2024-05-28 ENCOUNTER — Encounter: Payer: Self-pay | Admitting: Cardiology

## 2024-05-28 ENCOUNTER — Ambulatory Visit: Attending: Internal Medicine | Admitting: Cardiology

## 2024-05-28 ENCOUNTER — Other Ambulatory Visit: Payer: Self-pay

## 2024-05-28 VITALS — BP 150/88 | HR 73 | Ht 64.75 in | Wt 185.6 lb

## 2024-05-28 DIAGNOSIS — I10 Essential (primary) hypertension: Secondary | ICD-10-CM

## 2024-05-28 DIAGNOSIS — R55 Syncope and collapse: Secondary | ICD-10-CM

## 2024-05-28 NOTE — Addendum Note (Signed)
 Addended by: Elius Etheredge N on: 05/28/2024 01:48 PM   Modules accepted: Orders

## 2024-05-28 NOTE — Addendum Note (Signed)
 Addended by: Casmere Hollenbeck N on: 05/28/2024 01:59 PM   Modules accepted: Orders

## 2024-05-28 NOTE — Patient Instructions (Addendum)
 Medication Instructions:  Your physician recommends that you continue on your current medications as directed. Please refer to the Current Medication list given to you today.  *If you need a refill on your cardiac medications before your next appointment, please call your pharmacy*  Lab Work: 8 am Cortisol level  If you have labs (blood work) drawn today and your tests are completely normal, you will receive your results only by: MyChart Message (if you have MyChart) OR A paper copy in the mail If you have any lab test that is abnormal or we need to change your treatment, we will call you to review the results.  Testing/Procedures: Echocardiogram Your physician has requested that you have an echocardiogram. Echocardiography is a painless test that uses sound waves to create images of your heart. It provides your doctor with information about the size and shape of your heart and how well your heart's chambers and valves are working. This procedure takes approximately one hour. There are no restrictions for this procedure. Please do NOT wear cologne, perfume, aftershave, or lotions (deodorant is allowed). Please arrive 15 minutes prior to your appointment time.  Please note: We ask at that you not bring children with you during ultrasound (echo/ vascular) testing. Due to room size and safety concerns, children are not allowed in the ultrasound rooms during exams. Our front office staff cannot provide observation of children in our lobby area while testing is being conducted. An adult accompanying a patient to their appointment will only be allowed in the ultrasound room at the discretion of the ultrasound technician under special circumstances. We apologize for any inconvenience.  30 Day Event Monitor Your physician has recommended that you wear an event monitor. Event monitors are medical devices that record the heart's electrical activity. Doctors most often us  these monitors to diagnose  arrhythmias. Arrhythmias are problems with the speed or rhythm of the heartbeat. The monitor is a small, portable device. You can wear one while you do your normal daily activities. This is usually used to diagnose what is causing palpitations/syncope (passing out).   Lexiscan Myoview Stress Test   Follow-Up: At [facility], you and your health needs are our priority.  As part of our continuing mission to provide you with exceptional heart care, our providers are all part of one team.  This team includes your primary Cardiologist (physician) and Advanced Practice Providers or APPs (Physician Assistants and Nurse Practitioners) who all work together to provide you with the care you need, when you need it.  Your next appointment:   As needed  Provider:   Shlomo, MD  We recommend signing up for the patient portal called MyChart.  Sign up information is provided on this After Visit Summary.  MyChart is used to connect with patients for Virtual Visits (Telemedicine).  Patients are able to view lab/test results, encounter notes, upcoming appointments, etc.  Non-urgent messages can be sent to your provider as well.   To learn more about what you can do with MyChart, go to [email or web address].

## 2024-05-31 ENCOUNTER — Encounter (HOSPITAL_COMMUNITY): Payer: Self-pay | Admitting: *Deleted

## 2024-05-31 DIAGNOSIS — R55 Syncope and collapse: Secondary | ICD-10-CM | POA: Diagnosis not present

## 2024-05-31 DIAGNOSIS — I10 Essential (primary) hypertension: Secondary | ICD-10-CM | POA: Diagnosis not present

## 2024-06-01 DIAGNOSIS — R55 Syncope and collapse: Secondary | ICD-10-CM | POA: Diagnosis not present

## 2024-06-01 LAB — CORTISOL: Cortisol: 10.6 ug/dL (ref 6.2–19.4)

## 2024-06-03 ENCOUNTER — Ambulatory Visit: Payer: Self-pay

## 2024-06-03 DIAGNOSIS — I351 Nonrheumatic aortic (valve) insufficiency: Secondary | ICD-10-CM

## 2024-06-03 DIAGNOSIS — I340 Nonrheumatic mitral (valve) insufficiency: Secondary | ICD-10-CM

## 2024-06-07 ENCOUNTER — Other Ambulatory Visit: Payer: Self-pay | Admitting: Cardiology

## 2024-06-07 DIAGNOSIS — R55 Syncope and collapse: Secondary | ICD-10-CM

## 2024-06-10 ENCOUNTER — Ambulatory Visit (HOSPITAL_COMMUNITY)
Admission: RE | Admit: 2024-06-10 | Discharge: 2024-06-10 | Disposition: A | Discharge status: Home / Self Care | Source: Ambulatory Visit | Attending: Cardiology | Admitting: Cardiology

## 2024-06-10 DIAGNOSIS — R55 Syncope and collapse: Secondary | ICD-10-CM

## 2024-06-10 LAB — MYOCARDIAL PERFUSION IMAGING
Angina Index: 0
Estimated workload: 7
Exercise duration (min): 5 min
Exercise duration (sec): 3 s
LV dias vol: 79 mL (ref 46–106)
LV sys vol: 18 mL (ref 3.8–5.2)
MPHR: 157 {beats}/min
Nuc Stress EF: 77 %
Peak HR: 144 {beats}/min
Percent HR: 91 %
Rest HR: 67 {beats}/min
Rest Nuclear Isotope Dose: 11 mCi
SDS: 0
SRS: 12
SSS: 3
Stress Nuclear Isotope Dose: 31.5 mCi
TID: 1

## 2024-06-10 MED ORDER — TECHNETIUM TC 99M TETROFOSMIN IV KIT
31.5000 | PACK | Freq: Once | INTRAVENOUS | Status: AC | PRN
  Administered 2024-06-10 (×2): 31.5 via INTRAVENOUS

## 2024-06-10 MED ORDER — TECHNETIUM TC 99M TETROFOSMIN IV KIT
11.0000 | PACK | Freq: Once | INTRAVENOUS | Status: AC | PRN
  Administered 2024-06-10 (×2): 11 via INTRAVENOUS

## 2024-06-14 ENCOUNTER — Ambulatory Visit: Payer: Self-pay | Admitting: Cardiology

## 2024-06-20 DIAGNOSIS — R5382 Chronic fatigue, unspecified: Secondary | ICD-10-CM | POA: Diagnosis not present

## 2024-06-20 DIAGNOSIS — R519 Headache, unspecified: Secondary | ICD-10-CM | POA: Diagnosis not present

## 2024-06-20 DIAGNOSIS — Z1321 Encounter for screening for nutritional disorder: Secondary | ICD-10-CM | POA: Diagnosis not present

## 2024-06-20 DIAGNOSIS — I10 Essential (primary) hypertension: Secondary | ICD-10-CM | POA: Diagnosis not present

## 2024-06-20 DIAGNOSIS — Z1329 Encounter for screening for other suspected endocrine disorder: Secondary | ICD-10-CM | POA: Diagnosis not present

## 2024-06-20 DIAGNOSIS — M797 Fibromyalgia: Secondary | ICD-10-CM | POA: Diagnosis not present

## 2024-06-20 DIAGNOSIS — E559 Vitamin D deficiency, unspecified: Secondary | ICD-10-CM | POA: Diagnosis not present

## 2024-06-21 DIAGNOSIS — D485 Neoplasm of uncertain behavior of skin: Secondary | ICD-10-CM | POA: Diagnosis not present

## 2024-06-21 DIAGNOSIS — L814 Other melanin hyperpigmentation: Secondary | ICD-10-CM | POA: Diagnosis not present

## 2024-06-21 DIAGNOSIS — D2239 Melanocytic nevi of other parts of face: Secondary | ICD-10-CM | POA: Diagnosis not present

## 2024-06-21 DIAGNOSIS — L820 Inflamed seborrheic keratosis: Secondary | ICD-10-CM | POA: Diagnosis not present

## 2024-06-21 DIAGNOSIS — L821 Other seborrheic keratosis: Secondary | ICD-10-CM | POA: Diagnosis not present

## 2024-06-21 DIAGNOSIS — C44319 Basal cell carcinoma of skin of other parts of face: Secondary | ICD-10-CM | POA: Diagnosis not present

## 2024-06-21 DIAGNOSIS — L570 Actinic keratosis: Secondary | ICD-10-CM | POA: Diagnosis not present

## 2024-06-28 ENCOUNTER — Ambulatory Visit (HOSPITAL_COMMUNITY)
Admission: RE | Admit: 2024-06-28 | Discharge: 2024-06-28 | Disposition: A | Discharge status: Home / Self Care | Source: Ambulatory Visit | Attending: Cardiology | Admitting: Cardiology

## 2024-06-28 DIAGNOSIS — R55 Syncope and collapse: Secondary | ICD-10-CM | POA: Insufficient documentation

## 2024-06-28 LAB — ECHOCARDIOGRAM COMPLETE
Area-P 1/2: 4.35 cm2
S' Lateral: 2.7 cm

## 2024-06-29 DIAGNOSIS — I351 Nonrheumatic aortic (valve) insufficiency: Secondary | ICD-10-CM | POA: Insufficient documentation

## 2024-06-29 DIAGNOSIS — I340 Nonrheumatic mitral (valve) insufficiency: Secondary | ICD-10-CM | POA: Insufficient documentation

## 2024-07-03 ENCOUNTER — Encounter: Payer: Self-pay | Admitting: Cardiology

## 2024-07-03 ENCOUNTER — Ambulatory Visit: Attending: Cardiology

## 2024-07-03 DIAGNOSIS — I4719 Other supraventricular tachycardia: Secondary | ICD-10-CM | POA: Insufficient documentation

## 2024-07-03 DIAGNOSIS — R55 Syncope and collapse: Secondary | ICD-10-CM | POA: Diagnosis not present

## 2024-07-04 ENCOUNTER — Other Ambulatory Visit: Payer: Self-pay

## 2024-07-04 ENCOUNTER — Encounter: Payer: Self-pay | Admitting: Neurology

## 2024-07-04 ENCOUNTER — Ambulatory Visit: Admitting: Neurology

## 2024-07-04 VITALS — BP 124/90 | HR 83 | Ht 64.0 in | Wt 187.4 lb

## 2024-07-04 DIAGNOSIS — R404 Transient alteration of awareness: Secondary | ICD-10-CM

## 2024-07-04 DIAGNOSIS — R55 Syncope and collapse: Secondary | ICD-10-CM

## 2024-07-04 MED ORDER — PYRIDOSTIGMINE BROMIDE 60 MG PO TABS: 30.0000 mg | ORAL_TABLET | Freq: Three times a day (TID) | ORAL | 5 refills | Status: AC
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
## 2024-07-04 NOTE — Patient Instructions (Signed)
 Assessment:  1)  Unclear origin of  frequent syncope - suspect dysautonomia , but cardiac work up was normal and neurologic exam is normal.  Orthostatic BP  dropped by 18 points , but heart rate did not compensate initially.  Lightheadedness explained.  2) I doubt seizure activity but was surprised to learn that the patient felt very tired after the last syncope and that some spells have had been followed by prolonged periods of unawareness.  3) I really don't know what to do about fibromyalgia, except recommending a rheumatologic work -up.  4) Family history of hypersomnia.   Plan:    1) EEG to just have a baseline  2) I would offer an empyric trial of mestinon , acetylcholine may help to regulate the orthostatics.  My team and MD combined  took  total time of  60  minutes,  consistent of a part of face to face encounter , exam and interview,  and additional preparation time for chart review was spent .  At today's visit, we discussed treatment options, associated risk and benefits, and engage in counseling as needed including, but not limited to:   Fall prevention  To rise slowly, preventing light headedness, take 2 deep breath before moving.  Hydrate well '     Weight loss is a viable treatment for patients whose BMI exceeds 30 .  Regular exercise when not exposed to heat and humidity.   She needs to work on her core strength.  Sleep hygiene, Quality Sleep Habits, and Safety concerns for patients with daytime sleepiness who are warned to not operate machinery/ motor vehicles when drowsy.    Additionally, the following were reviewed: Past medical records, past medical and surgical history, family and social background, as well as relevant laboratory results, imaging findings, and medical notes, where applicable.    This note was generated by myself in part by using dictation software, and as a result, it may contain unintentional typos and errors.  Nevertheless, effort was made to  accurately convey the pertinent aspects of the patient's visit.

## 2024-07-19 DIAGNOSIS — C44319 Basal cell carcinoma of skin of other parts of face: Secondary | ICD-10-CM | POA: Diagnosis not present

## 2024-07-19 DIAGNOSIS — Z85828 Personal history of other malignant neoplasm of skin: Secondary | ICD-10-CM | POA: Diagnosis not present

## 2024-07-30 ENCOUNTER — Ambulatory Visit: Admitting: Neurology

## 2024-07-30 DIAGNOSIS — R55 Syncope and collapse: Secondary | ICD-10-CM | POA: Diagnosis not present

## 2024-07-30 DIAGNOSIS — R404 Transient alteration of awareness: Secondary | ICD-10-CM

## 2024-08-02 ENCOUNTER — Ambulatory Visit: Payer: Self-pay | Admitting: Neurology

## 2024-08-02 NOTE — Procedures (Signed)
 [facility]  EEG (ELECTROENCEPHALOGRAM) REPORT  [name]    ORDERING CLINICIAN: Dedra Gores, M.D.  TECHNOLOGIST: , Burnard Plummer REEGT TECHNIQUE:  This EEG study was done with scalp electrodes positioned according to the 10-20 International system of electrode placement. Electrical activity was reviewed with band pass filter of 1-70Hz , sensitivity of 7 uV/mm, display speed of 54mm/sec with a 60Hz  notched filter applied as appropriate. EEG data were recorded continuously and digitally stored.    Recoding duration : 27 m 09 sec Activation included:  Photic stimulation and Hyperventilation  .   Description: The EEG's posterior dominant background rhythm of 11 hertz was symmetrically displayed while the patient's eyes were closed  and promptly attenuated with eye opening. At baseline, the recording showed a symmetrical moderate amplitude.  Photic stimulation was initiated at frequencies from 3- through 21 hertz, resulting in photic entrainment at  all frequencies,  without rhythmic discharges or epileptiform activity. Hyperventilation maneuver was initiated,  leading to symmetric amplitude build-up and slowing.  Following hyperventilation the patient's EEG was reviewed for a period of 1 and 2 minutes post maneuver, and indicated  relaxation. Sleep was not seen.    ECG: heart rate at 62 bpm in NSR  IMPRESSION:  This is a normal EEG recording accompanied by a regular heart rate.  No epileptiform discharges were seen.    Dr. Dedra Gores, M.D. Accredited by the ABPN, ABSM.

## 2024-08-26 DIAGNOSIS — M8588 Other specified disorders of bone density and structure, other site: Secondary | ICD-10-CM | POA: Diagnosis not present

## 2024-08-26 DIAGNOSIS — M797 Fibromyalgia: Secondary | ICD-10-CM | POA: Diagnosis not present

## 2024-08-26 DIAGNOSIS — Z23 Encounter for immunization: Secondary | ICD-10-CM | POA: Diagnosis not present

## 2024-08-26 DIAGNOSIS — I10 Essential (primary) hypertension: Secondary | ICD-10-CM | POA: Diagnosis not present

## 2024-08-26 DIAGNOSIS — K219 Gastro-esophageal reflux disease without esophagitis: Secondary | ICD-10-CM | POA: Diagnosis not present

## 2024-09-03 DIAGNOSIS — Z01419 Encounter for gynecological examination (general) (routine) without abnormal findings: Secondary | ICD-10-CM | POA: Diagnosis not present

## 2024-09-04 ENCOUNTER — Encounter: Payer: Self-pay | Admitting: Cardiology

## 2024-10-10 DIAGNOSIS — Z1231 Encounter for screening mammogram for malignant neoplasm of breast: Secondary | ICD-10-CM | POA: Diagnosis not present

## 2024-10-29 ENCOUNTER — Encounter: Payer: Self-pay | Admitting: Cardiology

## 2024-10-29 ENCOUNTER — Ambulatory Visit: Attending: Cardiology | Admitting: Cardiology

## 2024-10-29 VITALS — BP 128/74 | HR 71 | Ht 64.5 in | Wt 195.0 lb

## 2024-10-29 DIAGNOSIS — I340 Nonrheumatic mitral (valve) insufficiency: Secondary | ICD-10-CM

## 2024-10-29 DIAGNOSIS — R55 Syncope and collapse: Secondary | ICD-10-CM

## 2024-10-29 DIAGNOSIS — I10 Essential (primary) hypertension: Secondary | ICD-10-CM

## 2024-10-29 DIAGNOSIS — I351 Nonrheumatic aortic (valve) insufficiency: Secondary | ICD-10-CM

## 2024-10-29 NOTE — Patient Instructions (Signed)
 Medication Instructions:  Your physician recommends that you continue on your current medications as directed. Please refer to the Current Medication list given to you today.  Labwork: None ordered.  Testing/Procedures: None ordered.  Follow-Up:  As needed with Dr. Jimmey Ralph  Implantable Loop Recorder Placement, Care After This sheet gives you information about how to care for yourself after your procedure. Your health care provider may also give you more specific instructions. If you have problems or questions, contact your health care provider. What can I expect after the procedure? After the procedure, it is common to have: Soreness or discomfort near the incision. Some swelling or bruising near the incision.  Follow these instructions at home: Incision care  Monitor your cardiac device site for redness, swelling, and drainage. Call the device clinic at [phone] if you experience these symptoms or fever/chills.  Keep the large square bandage on your site for 24 hours and then you may remove it yourself. Keep the steri-strips underneath in place.   You may shower after 72 hours / 3 days from your procedure with the steri-strips in place. They will usually fall off on their own, or may be removed after 10 days. Pat dry.   Avoid lotions, ointments, or perfumes over your incision until it is well-healed.  Please do not submerge in water until your site is completely healed.   Your device is MRI compatible.   Remote monitoring is used to monitor your cardiac device from home. This monitoring is scheduled every month by our office. It allows [place] to keep an eye on the function of your device to ensure it is working properly.  If your wound site starts to bleed apply pressure.    If you have any questions/concerns please call the device clinic at [phone].  Activity  Return to your normal activities.  General instructions Follow instructions from your health care provider  about how to manage your implantable loop recorder and transmit the information. Learn how to activate a recording if this is necessary for your type of device. You may go through a metal detection gate, and you may let someone hold a metal detector over your chest. Show your ID card if needed. Do not have an MRI unless you check with your health care provider first. Take over-the-counter and prescription medicines only as told by your health care provider. Keep all follow-up visits as told by your health care provider. This is important. Contact a health care provider if: You have redness, swelling, or pain around your incision. You have a fever. You have pain that is not relieved by your pain medicine. You have triggered your device because of fainting (syncope) or because of a heartbeat that feels like it is racing, slow, fluttering, or skipping (palpitations). Get help right away if you have: Chest pain. Difficulty breathing. Summary After the procedure, it is common to have soreness or discomfort near the incision. Change your dressing as told by your health care provider. Follow instructions from your health care provider about how to manage your implantable loop recorder and transmit the information. Keep all follow-up visits as told by your health care provider. This is important.  This information is not intended to replace advice given to you by your health care provider. Make sure you discuss any questions you have with your health care provider. Document Released: 09/28/2015 Document Revised: 12/02/2017 Document Reviewed: 12/02/2017 Elsevier Patient Education  2020 [employer].

## 2024-10-29 NOTE — Progress Notes (Unsigned)
" °  Electrophysiology Office Note:   Date:  10/29/2024  ID:  [name], DOB 07/09/1961, MRN [id number]  Primary Cardiologist: None Electrophysiologist: Fonda Kitty, MD  {Click to update primary MD,subspecialty MD or APP then REFRESH:1}    History of Present Illness:   [name] is a 63 y.o. female with h/o fibromyalgia, GERD, HTN and syncope who is being seen today for evaluation for loop recorder implant.  Discussed the use of AI scribe software for clinical note transcription with the patient, who gave verbal consent to proceed.  History of Present Illness     Review of systems complete and found to be negative unless listed in HPI.   EP Information / Studies Reviewed:    EKG is not ordered today. EKG from 05/28/24 reviewed which showed SR with PR and QRS 72ms.     Cardiac Monitor 07/03/24:   Echo 06/28/24:  1. Left ventricular ejection fraction, by estimation, is 55 to 60%. The  left ventricle has normal function. The left ventricle has no regional  wall motion abnormalities. Left ventricular diastolic parameters were  normal.   2. Right ventricular systolic function is normal. The right ventricular  size is normal.   3. Left atrial size was mildly dilated.   4. The mitral valve is normal in structure. Mild mitral valve  regurgitation.   5. The aortic valve is tricuspid. Aortic valve regurgitation is mild.  Aortic valve sclerosis is present, with no evidence of aortic valve        Physical Exam:   VS:  There were no vitals taken for this visit.   Wt Readings from Last 3 Encounters:  07/04/24 187 lb 6.4 oz (85 kg)  05/28/24 185 lb 9.6 oz (84.2 kg)     GEN: Well nourished, well developed in no acute distress NECK: No JVD CARDIAC: {EPRHYTHM:28826}, no murmurs, rubs, gallops RESPIRATORY:  Clear to auscultation without rales, wheezing or rhonchi  ABDOMEN: Soft, non-distended EXTREMITIES:  No edema; No deformity   ASSESSMENT AND PLAN:    #.  Syncope: Assessment & Plan       Follow up with {EPMDS:28135::EP Team} {EPFOLLOW LE:71826}  Signed, Fonda Kitty, MD  "

## 2024-11-04 LAB — CUP PACEART REMOTE DEVICE CHECK
Date Time Interrogation Session: 20251230141900
Pulse Gen Serial Number: 171197

## 2024-11-30 ENCOUNTER — Ambulatory Visit: Attending: Cardiology

## 2024-11-30 DIAGNOSIS — R55 Syncope and collapse: Secondary | ICD-10-CM

## 2024-12-02 LAB — CUP PACEART REMOTE DEVICE CHECK
Date Time Interrogation Session: 20260131022400
Implantable Pulse Generator Implant Date: 20251230
Pulse Gen Serial Number: 171197

## 2024-12-06 NOTE — Progress Notes (Signed)
 Remote Loop Recorder Transmission

## 2024-12-31 ENCOUNTER — Ambulatory Visit

## 2025-01-06 ENCOUNTER — Ambulatory Visit: Admitting: Neurology

## 2025-01-29 ENCOUNTER — Ambulatory Visit: Admitting: Neurology

## 2025-01-31 ENCOUNTER — Ambulatory Visit

## 2025-03-03 ENCOUNTER — Ambulatory Visit

## 2025-04-03 ENCOUNTER — Ambulatory Visit

## 2025-05-04 ENCOUNTER — Ambulatory Visit

## 2025-06-04 ENCOUNTER — Ambulatory Visit

## 2025-07-05 ENCOUNTER — Ambulatory Visit

## 2025-08-05 ENCOUNTER — Ambulatory Visit

## 2025-09-05 ENCOUNTER — Ambulatory Visit
